# Patient Record
Sex: Female | Born: 1955
Health system: Southern US, Community
[De-identification: ages and names within clinical notes are randomized; demographics above are authoritative.]

## PROBLEM LIST (undated history)

## (undated) DIAGNOSIS — K3184 Gastroparesis: Secondary | ICD-10-CM

## (undated) DIAGNOSIS — L089 Local infection of the skin and subcutaneous tissue, unspecified: Secondary | ICD-10-CM

## (undated) DIAGNOSIS — I10 Essential (primary) hypertension: Secondary | ICD-10-CM

## (undated) DIAGNOSIS — E785 Hyperlipidemia, unspecified: Secondary | ICD-10-CM

## (undated) DIAGNOSIS — K5730 Diverticulosis of large intestine without perforation or abscess without bleeding: Secondary | ICD-10-CM

## (undated) DIAGNOSIS — B958 Unspecified staphylococcus as the cause of diseases classified elsewhere: Secondary | ICD-10-CM

## (undated) DIAGNOSIS — M797 Fibromyalgia: Secondary | ICD-10-CM

## (undated) DIAGNOSIS — K219 Gastro-esophageal reflux disease without esophagitis: Secondary | ICD-10-CM

## (undated) DIAGNOSIS — D649 Anemia, unspecified: Secondary | ICD-10-CM

## (undated) DIAGNOSIS — K8020 Calculus of gallbladder without cholecystitis without obstruction: Secondary | ICD-10-CM

## (undated) DIAGNOSIS — K635 Polyp of colon: Secondary | ICD-10-CM

## (undated) DIAGNOSIS — G4730 Sleep apnea, unspecified: Secondary | ICD-10-CM

## (undated) HISTORY — DX: Hyperlipidemia, unspecified: E78.5

## (undated) HISTORY — DX: Essential (primary) hypertension: I10

## (undated) HISTORY — DX: Gastroparesis: K31.84

## (undated) HISTORY — DX: Anemia, unspecified: D64.9

## (undated) HISTORY — DX: Fibromyalgia: M79.7

## (undated) HISTORY — PX: COSMETIC SURGERY: SHX468

## (undated) HISTORY — PX: KNEE ARTHROSCOPY: SUR90

## (undated) HISTORY — DX: Diverticulosis of large intestine without perforation or abscess without bleeding: K57.30

## (undated) HISTORY — DX: Unspecified staphylococcus as the cause of diseases classified elsewhere: B95.8

## (undated) HISTORY — PX: BREAST REDUCTION SURGERY: SHX8

## (undated) HISTORY — DX: Local infection of the skin and subcutaneous tissue, unspecified: L08.9

## (undated) HISTORY — DX: Gastro-esophageal reflux disease without esophagitis: K21.9

## (undated) HISTORY — DX: Polyp of colon: K63.5

## (undated) HISTORY — DX: Morbid (severe) obesity due to excess calories: E66.01

## (undated) HISTORY — DX: Sleep apnea, unspecified: G47.30

## (undated) HISTORY — PX: POLYPECTOMY: SHX149

## (undated) HISTORY — PX: COLONOSCOPY: SHX174

---

## 1998-02-25 ENCOUNTER — Other Ambulatory Visit: Admission: RE | Admit: 1998-02-25 | Discharge: 1998-02-25 | Payer: Self-pay | Admitting: *Deleted

## 1999-05-20 ENCOUNTER — Other Ambulatory Visit: Admission: RE | Admit: 1999-05-20 | Discharge: 1999-05-20 | Payer: Self-pay | Admitting: *Deleted

## 2000-07-05 ENCOUNTER — Encounter: Admission: RE | Admit: 2000-07-05 | Discharge: 2000-07-05 | Payer: Self-pay | Admitting: *Deleted

## 2000-07-05 ENCOUNTER — Encounter: Payer: Self-pay | Admitting: *Deleted

## 2000-10-19 ENCOUNTER — Encounter: Admission: RE | Admit: 2000-10-19 | Discharge: 2001-01-17 | Payer: Self-pay | Admitting: Internal Medicine

## 2001-06-20 ENCOUNTER — Other Ambulatory Visit: Admission: RE | Admit: 2001-06-20 | Discharge: 2001-06-20 | Payer: Self-pay | Admitting: *Deleted

## 2002-09-17 ENCOUNTER — Encounter: Admission: RE | Admit: 2002-09-17 | Discharge: 2002-09-17 | Payer: Self-pay | Admitting: Internal Medicine

## 2002-09-17 ENCOUNTER — Encounter: Payer: Self-pay | Admitting: Internal Medicine

## 2004-10-12 ENCOUNTER — Ambulatory Visit: Payer: Self-pay | Admitting: Family Medicine

## 2005-01-13 ENCOUNTER — Ambulatory Visit: Payer: Self-pay | Admitting: Internal Medicine

## 2005-03-30 ENCOUNTER — Ambulatory Visit: Payer: Self-pay | Admitting: Internal Medicine

## 2005-05-10 ENCOUNTER — Ambulatory Visit: Payer: Self-pay | Admitting: Internal Medicine

## 2005-08-04 ENCOUNTER — Ambulatory Visit: Payer: Self-pay | Admitting: Internal Medicine

## 2005-08-21 ENCOUNTER — Ambulatory Visit: Payer: Self-pay | Admitting: Family Medicine

## 2005-08-25 ENCOUNTER — Ambulatory Visit: Payer: Self-pay | Admitting: Internal Medicine

## 2005-09-08 ENCOUNTER — Ambulatory Visit: Payer: Self-pay | Admitting: Internal Medicine

## 2005-09-12 ENCOUNTER — Ambulatory Visit: Payer: Self-pay | Admitting: Family Medicine

## 2005-10-02 ENCOUNTER — Ambulatory Visit: Payer: Self-pay | Admitting: Internal Medicine

## 2005-10-06 ENCOUNTER — Encounter: Admission: RE | Admit: 2005-10-06 | Discharge: 2005-10-06 | Payer: Self-pay | Admitting: Orthopedic Surgery

## 2005-10-09 ENCOUNTER — Ambulatory Visit (HOSPITAL_BASED_OUTPATIENT_CLINIC_OR_DEPARTMENT_OTHER): Admission: RE | Admit: 2005-10-09 | Discharge: 2005-10-09 | Payer: Self-pay | Admitting: Orthopedic Surgery

## 2005-10-09 ENCOUNTER — Ambulatory Visit (HOSPITAL_COMMUNITY): Admission: RE | Admit: 2005-10-09 | Discharge: 2005-10-09 | Payer: Self-pay | Admitting: Orthopedic Surgery

## 2005-10-11 ENCOUNTER — Ambulatory Visit: Payer: Self-pay | Admitting: Internal Medicine

## 2005-10-18 ENCOUNTER — Ambulatory Visit: Payer: Self-pay | Admitting: Internal Medicine

## 2005-10-24 ENCOUNTER — Ambulatory Visit: Payer: Self-pay | Admitting: Family Medicine

## 2005-10-27 ENCOUNTER — Ambulatory Visit: Payer: Self-pay | Admitting: Internal Medicine

## 2005-11-16 ENCOUNTER — Ambulatory Visit: Payer: Self-pay | Admitting: Internal Medicine

## 2005-11-27 ENCOUNTER — Ambulatory Visit: Payer: Self-pay | Admitting: Internal Medicine

## 2006-01-08 ENCOUNTER — Ambulatory Visit: Payer: Self-pay | Admitting: Internal Medicine

## 2006-01-18 ENCOUNTER — Encounter: Admission: RE | Admit: 2006-01-18 | Discharge: 2006-01-18 | Payer: Self-pay | Admitting: Rheumatology

## 2006-01-31 ENCOUNTER — Encounter: Admission: RE | Admit: 2006-01-31 | Discharge: 2006-01-31 | Payer: Self-pay | Admitting: Rheumatology

## 2006-02-05 ENCOUNTER — Ambulatory Visit: Payer: Self-pay | Admitting: Internal Medicine

## 2006-02-13 ENCOUNTER — Ambulatory Visit: Payer: Self-pay | Admitting: Family Medicine

## 2006-03-07 ENCOUNTER — Ambulatory Visit: Payer: Self-pay | Admitting: Family Medicine

## 2006-04-06 ENCOUNTER — Ambulatory Visit: Payer: Self-pay | Admitting: Family Medicine

## 2006-04-10 ENCOUNTER — Ambulatory Visit: Payer: Self-pay | Admitting: Internal Medicine

## 2006-05-03 ENCOUNTER — Ambulatory Visit: Payer: Self-pay | Admitting: Internal Medicine

## 2006-06-05 ENCOUNTER — Ambulatory Visit: Payer: Self-pay | Admitting: Internal Medicine

## 2006-06-06 ENCOUNTER — Encounter: Admission: RE | Admit: 2006-06-06 | Discharge: 2006-06-06 | Payer: Self-pay | Admitting: Rheumatology

## 2006-07-04 ENCOUNTER — Ambulatory Visit: Payer: Self-pay | Admitting: Internal Medicine

## 2006-07-18 ENCOUNTER — Encounter: Admission: RE | Admit: 2006-07-18 | Discharge: 2006-08-15 | Payer: Self-pay | Admitting: Internal Medicine

## 2006-08-10 ENCOUNTER — Ambulatory Visit: Payer: Self-pay | Admitting: Internal Medicine

## 2006-09-06 ENCOUNTER — Ambulatory Visit: Payer: Self-pay | Admitting: Internal Medicine

## 2006-09-11 ENCOUNTER — Encounter: Admission: RE | Admit: 2006-09-11 | Discharge: 2006-12-10 | Payer: Self-pay | Admitting: Internal Medicine

## 2006-09-26 ENCOUNTER — Other Ambulatory Visit: Admission: RE | Admit: 2006-09-26 | Discharge: 2006-09-26 | Payer: Self-pay | Admitting: Obstetrics and Gynecology

## 2006-10-05 ENCOUNTER — Ambulatory Visit: Payer: Self-pay | Admitting: Internal Medicine

## 2006-10-10 ENCOUNTER — Ambulatory Visit: Payer: Self-pay | Admitting: Internal Medicine

## 2006-10-10 LAB — CONVERTED CEMR LAB
ALT: 22 units/L (ref 0–40)
AST: 24 units/L (ref 0–37)
BUN: 15 mg/dL (ref 6–23)
Chol/HDL Ratio, serum: 3.1
Cholesterol: 179 mg/dL (ref 0–200)
Creatinine, Ser: 1.1 mg/dL (ref 0.4–1.2)
Free T4: 1 ng/dL (ref 0.9–1.8)
HDL: 57.8 mg/dL (ref >39.0)
Hgb A1c MFr Bld: 6.5 % — ABNORMAL HIGH (ref 4.6–6.0)
LDL Cholesterol: 97 mg/dL (ref 0–99)
Potassium: 3.4 meq/L — ABNORMAL LOW (ref 3.5–5.1)
TSH: 0.75 microintl units/mL (ref 0.35–5.50)
Triglyceride fasting, serum: 123 mg/dL (ref 0–149)
VLDL: 25 mg/dL (ref 0–40)

## 2006-10-26 ENCOUNTER — Ambulatory Visit: Payer: Self-pay | Admitting: Internal Medicine

## 2006-12-12 ENCOUNTER — Encounter: Admission: RE | Admit: 2006-12-12 | Discharge: 2007-03-12 | Payer: Self-pay | Admitting: Internal Medicine

## 2007-02-04 ENCOUNTER — Encounter: Admission: RE | Admit: 2007-02-04 | Discharge: 2007-02-04 | Payer: Self-pay | Admitting: Rheumatology

## 2007-02-21 ENCOUNTER — Ambulatory Visit: Payer: Self-pay | Admitting: Internal Medicine

## 2007-02-21 LAB — CONVERTED CEMR LAB: Vitamin B-12: 255 pg/mL (ref 211–911)

## 2007-03-07 ENCOUNTER — Encounter: Admission: RE | Admit: 2007-03-07 | Discharge: 2007-03-07 | Payer: Self-pay | Admitting: Rheumatology

## 2007-03-20 ENCOUNTER — Ambulatory Visit (HOSPITAL_BASED_OUTPATIENT_CLINIC_OR_DEPARTMENT_OTHER): Admission: RE | Admit: 2007-03-20 | Discharge: 2007-03-20 | Payer: Self-pay | Admitting: Rheumatology

## 2007-03-24 ENCOUNTER — Ambulatory Visit: Payer: Self-pay | Admitting: Internal Medicine

## 2007-03-26 ENCOUNTER — Encounter: Admission: RE | Admit: 2007-03-26 | Discharge: 2007-03-26 | Payer: Self-pay | Admitting: Rheumatology

## 2007-04-02 ENCOUNTER — Ambulatory Visit: Payer: Self-pay | Admitting: Internal Medicine

## 2007-04-02 ENCOUNTER — Encounter: Payer: Self-pay | Admitting: Internal Medicine

## 2007-04-23 ENCOUNTER — Encounter: Payer: Self-pay | Admitting: Internal Medicine

## 2007-04-23 ENCOUNTER — Ambulatory Visit: Payer: Self-pay | Admitting: Internal Medicine

## 2007-05-07 ENCOUNTER — Ambulatory Visit: Payer: Self-pay | Admitting: Internal Medicine

## 2007-05-07 LAB — CONVERTED CEMR LAB: Hgb A1c MFr Bld: 6.6 % — ABNORMAL HIGH (ref 4.6–6.0)

## 2007-05-27 ENCOUNTER — Ambulatory Visit: Payer: Self-pay | Admitting: Internal Medicine

## 2007-05-27 DIAGNOSIS — M810 Age-related osteoporosis without current pathological fracture: Secondary | ICD-10-CM | POA: Insufficient documentation

## 2007-05-27 DIAGNOSIS — E039 Hypothyroidism, unspecified: Secondary | ICD-10-CM | POA: Insufficient documentation

## 2007-05-27 LAB — CONVERTED CEMR LAB: TSH: 1.04 microintl units/mL (ref 0.35–5.50)

## 2007-05-28 ENCOUNTER — Encounter: Payer: Self-pay | Admitting: Internal Medicine

## 2007-05-31 ENCOUNTER — Encounter (INDEPENDENT_AMBULATORY_CARE_PROVIDER_SITE_OTHER): Payer: Self-pay | Admitting: *Deleted

## 2007-05-31 DIAGNOSIS — G4733 Obstructive sleep apnea (adult) (pediatric): Secondary | ICD-10-CM | POA: Insufficient documentation

## 2007-08-07 ENCOUNTER — Ambulatory Visit: Payer: Self-pay | Admitting: Internal Medicine

## 2007-08-07 ENCOUNTER — Telehealth (INDEPENDENT_AMBULATORY_CARE_PROVIDER_SITE_OTHER): Payer: Self-pay | Admitting: *Deleted

## 2007-08-07 LAB — CONVERTED CEMR LAB
Bilirubin Urine: NEGATIVE
Blood in Urine, dipstick: NEGATIVE
Glucose, Urine, Semiquant: NEGATIVE
Ketones, urine, test strip: NEGATIVE
Nitrite: NEGATIVE
Protein, U semiquant: NEGATIVE
Specific Gravity, Urine: 1.02
Urobilinogen, UA: NEGATIVE
WBC Urine, dipstick: NEGATIVE
pH: 5

## 2007-08-08 ENCOUNTER — Encounter: Payer: Self-pay | Admitting: Internal Medicine

## 2007-08-10 LAB — CONVERTED CEMR LAB: Hgb A1c MFr Bld: 7.1 % — ABNORMAL HIGH (ref 4.6–6.0)

## 2007-08-12 ENCOUNTER — Encounter (INDEPENDENT_AMBULATORY_CARE_PROVIDER_SITE_OTHER): Payer: Self-pay | Admitting: *Deleted

## 2007-08-15 ENCOUNTER — Ambulatory Visit: Payer: Self-pay | Admitting: Internal Medicine

## 2007-08-15 DIAGNOSIS — E119 Type 2 diabetes mellitus without complications: Secondary | ICD-10-CM | POA: Insufficient documentation

## 2007-08-15 DIAGNOSIS — I10 Essential (primary) hypertension: Secondary | ICD-10-CM | POA: Insufficient documentation

## 2007-09-30 ENCOUNTER — Ambulatory Visit: Payer: Self-pay | Admitting: Internal Medicine

## 2007-10-02 ENCOUNTER — Ambulatory Visit: Payer: Self-pay | Admitting: Internal Medicine

## 2007-10-02 LAB — CONVERTED CEMR LAB
BUN: 13 mg/dL (ref 6–23)
Basophils Absolute: 0.1 10*3/uL (ref 0.0–0.1)
Basophils Relative: 0.8 % (ref 0.0–1.0)
Creatinine, Ser: 1 mg/dL (ref 0.4–1.2)
Eosinophils Absolute: 0.2 10*3/uL (ref 0.0–0.6)
Eosinophils Relative: 2.6 % (ref 0.0–5.0)
HCT: 33.2 % — ABNORMAL LOW (ref 36.0–46.0)
Hemoglobin: 11.2 g/dL — ABNORMAL LOW (ref 12.0–15.0)
Lymphocytes Relative: 36.2 % (ref 12.0–46.0)
MCHC: 33.6 g/dL (ref 30.0–36.0)
MCV: 86.6 fL (ref 78.0–100.0)
Monocytes Absolute: 0.5 10*3/uL (ref 0.2–0.7)
Monocytes Relative: 7.3 % (ref 3.0–11.0)
Neutro Abs: 3.3 10*3/uL (ref 1.4–7.7)
Neutrophils Relative %: 53.1 % (ref 43.0–77.0)
Platelets: 355 10*3/uL (ref 150–400)
Potassium: 3.7 meq/L (ref 3.5–5.1)
RBC: 3.84 M/uL — ABNORMAL LOW (ref 3.87–5.11)
RDW: 14.7 % — ABNORMAL HIGH (ref 11.5–14.6)
Vitamin B-12: 244 pg/mL (ref 211–911)
WBC: 6.4 10*3/uL (ref 4.5–10.5)

## 2007-10-03 ENCOUNTER — Telehealth (INDEPENDENT_AMBULATORY_CARE_PROVIDER_SITE_OTHER): Payer: Self-pay | Admitting: *Deleted

## 2007-10-04 DIAGNOSIS — R5383 Other fatigue: Secondary | ICD-10-CM | POA: Insufficient documentation

## 2007-10-04 DIAGNOSIS — K219 Gastro-esophageal reflux disease without esophagitis: Secondary | ICD-10-CM | POA: Insufficient documentation

## 2007-10-04 DIAGNOSIS — D649 Anemia, unspecified: Secondary | ICD-10-CM | POA: Insufficient documentation

## 2007-10-04 DIAGNOSIS — Z9189 Other specified personal risk factors, not elsewhere classified: Secondary | ICD-10-CM | POA: Insufficient documentation

## 2007-10-04 DIAGNOSIS — R5381 Other malaise: Secondary | ICD-10-CM | POA: Insufficient documentation

## 2007-10-08 ENCOUNTER — Ambulatory Visit: Payer: Self-pay | Admitting: Internal Medicine

## 2007-10-08 DIAGNOSIS — E1165 Type 2 diabetes mellitus with hyperglycemia: Secondary | ICD-10-CM

## 2007-10-08 DIAGNOSIS — IMO0001 Reserved for inherently not codable concepts without codable children: Secondary | ICD-10-CM | POA: Insufficient documentation

## 2007-10-08 DIAGNOSIS — K209 Esophagitis, unspecified without bleeding: Secondary | ICD-10-CM | POA: Insufficient documentation

## 2007-10-10 LAB — CONVERTED CEMR LAB
Hgb A1c MFr Bld: 6.4 % — ABNORMAL HIGH (ref 4.6–6.0)
Iron: 54 ug/dL (ref 42–145)

## 2007-11-04 ENCOUNTER — Ambulatory Visit: Payer: Self-pay | Admitting: Gastroenterology

## 2007-11-06 ENCOUNTER — Encounter (INDEPENDENT_AMBULATORY_CARE_PROVIDER_SITE_OTHER): Payer: Self-pay | Admitting: *Deleted

## 2007-11-06 ENCOUNTER — Ambulatory Visit: Payer: Self-pay | Admitting: Internal Medicine

## 2007-11-07 ENCOUNTER — Telehealth (INDEPENDENT_AMBULATORY_CARE_PROVIDER_SITE_OTHER): Payer: Self-pay | Admitting: *Deleted

## 2007-11-26 ENCOUNTER — Encounter: Payer: Self-pay | Admitting: Internal Medicine

## 2007-11-26 ENCOUNTER — Ambulatory Visit: Payer: Self-pay | Admitting: Gastroenterology

## 2007-11-26 ENCOUNTER — Encounter: Payer: Self-pay | Admitting: Gastroenterology

## 2007-12-04 ENCOUNTER — Telehealth (INDEPENDENT_AMBULATORY_CARE_PROVIDER_SITE_OTHER): Payer: Self-pay | Admitting: *Deleted

## 2007-12-12 ENCOUNTER — Ambulatory Visit (HOSPITAL_COMMUNITY): Admission: RE | Admit: 2007-12-12 | Discharge: 2007-12-12 | Payer: Self-pay | Admitting: Gastroenterology

## 2007-12-20 ENCOUNTER — Encounter: Payer: Self-pay | Admitting: Internal Medicine

## 2008-01-09 ENCOUNTER — Ambulatory Visit: Payer: Self-pay | Admitting: Gastroenterology

## 2008-01-09 DIAGNOSIS — K3184 Gastroparesis: Secondary | ICD-10-CM | POA: Insufficient documentation

## 2008-03-16 ENCOUNTER — Telehealth (INDEPENDENT_AMBULATORY_CARE_PROVIDER_SITE_OTHER): Payer: Self-pay | Admitting: *Deleted

## 2008-03-30 ENCOUNTER — Ambulatory Visit: Payer: Self-pay | Admitting: Internal Medicine

## 2008-03-30 DIAGNOSIS — J310 Chronic rhinitis: Secondary | ICD-10-CM | POA: Insufficient documentation

## 2008-04-12 DIAGNOSIS — J45909 Unspecified asthma, uncomplicated: Secondary | ICD-10-CM | POA: Insufficient documentation

## 2008-04-12 DIAGNOSIS — J302 Other seasonal allergic rhinitis: Secondary | ICD-10-CM | POA: Insufficient documentation

## 2008-04-12 DIAGNOSIS — J3089 Other allergic rhinitis: Secondary | ICD-10-CM

## 2008-06-08 ENCOUNTER — Encounter: Payer: Self-pay | Admitting: Internal Medicine

## 2008-06-11 ENCOUNTER — Encounter: Payer: Self-pay | Admitting: Internal Medicine

## 2008-06-25 ENCOUNTER — Telehealth (INDEPENDENT_AMBULATORY_CARE_PROVIDER_SITE_OTHER): Payer: Self-pay | Admitting: *Deleted

## 2008-06-26 ENCOUNTER — Encounter: Payer: Self-pay | Admitting: Internal Medicine

## 2008-07-23 ENCOUNTER — Telehealth (INDEPENDENT_AMBULATORY_CARE_PROVIDER_SITE_OTHER): Payer: Self-pay | Admitting: *Deleted

## 2008-09-29 ENCOUNTER — Ambulatory Visit: Payer: Self-pay | Admitting: Internal Medicine

## 2008-11-24 ENCOUNTER — Encounter: Admission: RE | Admit: 2008-11-24 | Discharge: 2008-11-24 | Payer: Self-pay | Admitting: Obstetrics and Gynecology

## 2008-11-26 ENCOUNTER — Encounter: Admission: RE | Admit: 2008-11-26 | Discharge: 2008-11-26 | Payer: Self-pay | Admitting: Obstetrics and Gynecology

## 2009-10-25 ENCOUNTER — Telehealth (INDEPENDENT_AMBULATORY_CARE_PROVIDER_SITE_OTHER): Payer: Self-pay | Admitting: *Deleted

## 2009-10-28 ENCOUNTER — Encounter (INDEPENDENT_AMBULATORY_CARE_PROVIDER_SITE_OTHER): Payer: Self-pay | Admitting: *Deleted

## 2010-01-12 ENCOUNTER — Encounter: Payer: Self-pay | Admitting: Internal Medicine

## 2010-04-15 ENCOUNTER — Telehealth: Payer: Self-pay | Admitting: Gastroenterology

## 2010-04-19 ENCOUNTER — Encounter (INDEPENDENT_AMBULATORY_CARE_PROVIDER_SITE_OTHER): Payer: Self-pay | Admitting: *Deleted

## 2010-05-27 ENCOUNTER — Ambulatory Visit: Payer: Self-pay | Admitting: Gastroenterology

## 2010-05-27 DIAGNOSIS — Z860100 Personal history of colon polyps, unspecified: Secondary | ICD-10-CM | POA: Insufficient documentation

## 2010-05-27 DIAGNOSIS — E663 Overweight: Secondary | ICD-10-CM | POA: Insufficient documentation

## 2010-06-06 ENCOUNTER — Telehealth: Payer: Self-pay | Admitting: Gastroenterology

## 2010-09-07 ENCOUNTER — Encounter: Payer: Self-pay | Admitting: Gastroenterology

## 2010-09-08 ENCOUNTER — Encounter: Payer: Self-pay | Admitting: Gastroenterology

## 2010-09-28 ENCOUNTER — Encounter
Admission: RE | Admit: 2010-09-28 | Discharge: 2010-09-28 | Payer: Self-pay | Source: Home / Self Care | Attending: Endocrinology | Admitting: Endocrinology

## 2010-11-24 ENCOUNTER — Telehealth: Payer: Self-pay | Admitting: Gastroenterology

## 2010-11-29 ENCOUNTER — Telehealth (INDEPENDENT_AMBULATORY_CARE_PROVIDER_SITE_OTHER): Payer: Self-pay

## 2010-12-02 ENCOUNTER — Telehealth: Payer: Self-pay | Admitting: Gastroenterology

## 2010-12-11 ENCOUNTER — Encounter: Payer: Self-pay | Admitting: Gastroenterology

## 2010-12-20 NOTE — Miscellaneous (Signed)
Summary: Orders Update  Clinical Lists Changes  Problems: Added new problem of OBSTRUCTIVE SLEEP APNEA (ICD-327.23) - Signed Medications: Removed medication of MACROBID 100 MG  CAPS (NITROFURANTOIN MONOHYD MACRO) 1 two times a day - Signed Added new medication of CIPRO 500 MG  TABS (CIPROFLOXACIN HCL) 1 two times a day - Signed Rx of CIPRO 500 MG  TABS (CIPROFLOXACIN HCL) 1 two times a day;  #20 x 0;  Signed;  Entered by: Marga Melnick MD;  Authorized by: Marga Melnick MD;  Method used: Print then Give to Patient Orders: Added new Referral order of Sleep Disorder Referral (Sleep Disorder) - Signed    Prescriptions: CIPRO 500 MG  TABS (CIPROFLOXACIN HCL) 1 two times a day  #20 x 0   Entered and Authorized by:   Marga Melnick MD   Signed by:   Marga Melnick MD on 05/31/2007   Method used:   Print then Give to Patient   RxID:   [id number]

## 2010-12-20 NOTE — Letter (Signed)
Summary: Appt Reminder 2  [facility]  [address] [place], [place] [address]   Phone: [phone]  Fax: [phone]        Apr 19, 2010 MRN: [id number]    [facility] [address] [place], [place]  [address]    Dear Ms. Furney,   You have a return appointment with Dr.Robert Arlyce Dice on 05-27-10 at 9:45am.  Please remember to bring a complete list of the medicines you are taking, your insurance card and your co-pay.  If you have to cancel or reschedule this appointment, please call before 5:00 pm the evening before to avoid a cancellation fee.  If you have any questions or concerns, please call [phone].    Sincerely,    Laureen Ochs LPN  Appended Document: Appt Reminder 2 Letter mailed to patient.

## 2010-12-20 NOTE — Progress Notes (Signed)
Summary: rx called in today   Phone Note Call from Patient Call back at Home Phone [phone]   Caller: Patient Call For: Arlyce Dice Reason for Call: Talk to Nurse Summary of Call: Patient needs rx for Nexium called in to [facility] [address] today patient going out of town. Initial call taken by: Tawni Levy,  June 06, 2010 2:15 PM  Follow-up for Phone Call        Called pt to inform that Nexium and Reglan was sent into [facility] on [address]. Follow-up by: Merri Ray CMA Duncan Dull),  June 06, 2010 2:42 PM

## 2010-12-20 NOTE — Letter (Signed)
Summary: Results Follow up Letter  [place] [facility] [place]  [address] [place], [place] [address]   Phone: [phone]  Fax: [phone]    05/31/2007 MRN: [id number]   Toni Palmer [address] [place], [place]  [address]  Dear [name],  The following are the results of your recent test(s):  Test         Result    Pap Smear:        Normal _____  Not Normal _____ Comments: ______________________________________________________ Cholesterol: LDL(Bad cholesterol):         Your goal is less than:         HDL (Good cholesterol):       Your goal is more than: Comments:  ______________________________________________________ Mammogram:        Normal _____  Not Normal _____ Comments:  ___________________________________________________________________ Hemoccult:        Normal _____  Not normal _______ Comments:    _____________________________________________________________________ Other Tests:  Please see attached results and comments   We routinely do not discuss normal results over the telephone.  If you desire a copy of the results, or you have any questions about this information we can discuss them at your next office visit.   Sincerely,

## 2010-12-20 NOTE — Progress Notes (Signed)
Summary: refill flonase   Phone Note Call from Patient   Caller: Patient Call For: YOUNG Summary of Call: PT NEEDS REFILL OF FLONASE AND SAYS [facility] [address] HAS NOT RECEIVED A RESPONSE FOR REQ OF REFILL.  PT# [phone]. PATIENT'S CHART HAS BEEN REQUESTED Initial call taken by: Tivis Ringer,  December 04, 2007 3:53 PM  Follow-up for Phone Call        [facility] on pt's home and work number to notify of rx called in.  Left my name on machines.  Rx was not filled electroniacally because triage was the authorizing provider.  I called pharmacy and authorized as needed refills.  Pt is aware. .................................................................Marland KitchenMarland KitchenVernie Murders  December 04, 2007 4:11 PM  Follow-up by: Boone Master CNA,  December 04, 2007 4:08 PM      Prescriptions: Aleda Grana 50 MCG/ACT  SUSP (FLUTICASONE PROPIONATE) 1-2 sprays qd  #1 x prn   Entered by:   Boone Master CNA   Authorized by:   Pulmonary Triage   Signed by:   Boone Master CNA on 12/04/2007   Method used:   Electronically sent to ...       [facility] [address] [facility]*       [address]       [place], [place]  [address]       Ph: [phone] or [phone]       Fax: [phone]   RxID:   [id number]

## 2010-12-20 NOTE — Letter (Signed)
Summary: Results Follow up Letter   at [place]  [address] [place], [place] [address]   Phone: [phone]  Fax: [phone]    11/06/2007 MRN: [id number]  MARAJADE LEI [address] [place], [place]  [address]  Dear Ms. Bowsher,  The following are the results of your recent test(s):  Test         Result    Pap Smear:        Normal _____  Not Normal _____ Comments: ______________________________________________________ Cholesterol: LDL(Bad cholesterol):         Your goal is less than:         HDL (Good cholesterol):       Your goal is more than: Comments:  ______________________________________________________ Mammogram:        Normal __X___  Not Normal _____ Comments:  ___________________________________________________________________ Hemoccult:        Normal _____  Not normal _______ Comments:    _____________________________________________________________________ Other Tests:    We routinely do not discuss normal results over the telephone.  If you desire a copy of the results, or you have any questions about this information we can discuss them at your next office visit.   Sincerely,

## 2010-12-20 NOTE — Progress Notes (Signed)
Summary: Appointment Due  Phone Note Outgoing Call Call back at [facility] Phone [phone]   Call placed by: Shonna Chock,  October 25, 2009 10:31 AM Call placed to: Insurer Summary of Call: Please contact this patient to schedule a physical (30 min appointment), patient should come fasting for labs at the time of her physical. This is necessay in order for Dr.[name] to continue refilling her medications (If the patient ask)  Chrae Malloy  October 25, 2009 10:32 AM   Follow-up for Phone Call        I left a mess. informing patient to call office and get a  appt. in order to get a refill on meds. Follow-up by: Michaelle Copas,  October 25, 2009 11:19 AM    Additional Follow-up for Phone Call Additional follow up Details #2::    Mailed a letter.Marland KitchenMarland KitchenMarland KitchenBarb Merino  October 28, 2009 10:10 AM  Follow-up by: Barb Merino,  October 28, 2009 10:10 AM

## 2010-12-20 NOTE — Procedures (Signed)
Summary: colonoscopy  Dr Arlyce Dice  colonoscopy   Imported By: Freddy Jaksch 12/02/2007 12:12:23  _____________________________________________________________________  External Attachment:    Type:   Image     Comment:   inter

## 2010-12-20 NOTE — Assessment & Plan Note (Signed)
Summary: ov with meds.cbs  Medications Added ADVAIR DISKUS 250-50 MCG/DOSE MISC (FLUTICASONE-SALMETEROL)  FLONASE 50 MCG/ACT  SUSP (FLUTICASONE PROPIONATE)  CYMBALTA 30 MG  CPEP (DULOXETINE HCL) 3 tabs qd SYNTHROID 50 MCG  TABS (LEVOTHYROXINE SODIUM) 1 by mouth qd * LAMICTIL 150MG  QD  CLORAZEPATE DIPOTASSIUM 15 MG  TABS (CLORAZEPATE DIPOTASSIUM) prn ADDERALL XR 30 MG  CP24 (AMPHETAMINE-DEXTROAMPHETAMINE) 2 tabs qam and 1 qpm FUROSEMIDE 40 MG  TABS (FUROSEMIDE) 1 by mouth qd METFORMIN HCL 500 MG  TABS (METFORMIN HCL) 1 by mouth two times a day with 2 largest meals BUTALBITAL-APAP   TABS (BUTALBITAL-ACETAMINOPHEN TABS) prn * CPAP  MACROBID 100 MG  CAPS (NITROFURANTOIN MONOHYD MACRO) 1 two times a day        Vital Signs:  Patient Profile:   55 Years Old Female Weight:      224 pounds Pulse rate:   72 / minute Pulse rhythm:   regular BP sitting:   142 / 74  (left arm) Cuff size:   large  Pt. in pain?   no  Vitals Entered By: Wendall Stade (May 27, 2007 2:39 PM)                Chief Complaint:  fu  labs.  History of Present Illness: patient also has uti urine is pending. A1c was 6.6%     Family History:    Father: DM,CAD,COAD    Mother: breast CA    Siblings: sister CA    Review of Systems  Resp      Complains of hypersomnolence.      + sleep apnea; on CPAP  GU      Complains of dysuria and urinary hesitancy.      Denies hematuria and urinary frequency.   Physical Exam  General:     overweight-appearing.   Neck:     No deformities, masses, or tenderness noted. Lungs:     Normal respiratory effort, chest expands symmetrically. Lungs are clear to auscultation, no crackles or wheezes. Heart:     grade1  /6 systolic murmur.   Pulses:     R and L carotid,radial,femoral,dorsalis pedis and posterior tibial pulses are full and equal bilaterally Extremities:     No clubbing, cyanosis, edema, or deformity noted with normal full range of motion of all  joints.      Impression & Recommendations:  Problem # 1:  DIABETES-TYPE 2 (ICD-250.00)  Her updated medication list for this problem includes:    Metformin Hcl 500 Mg Tabs (Metformin hcl) .Marland Kitchen... 1 by mouth two times a day with 2 largest meals   Problem # 2:  U T I (ICD-599.0)  Orders: T-Culture, Urine ([id number])  Her updated medication list for this problem includes:    Macrobid 100 Mg Caps (Nitrofurantoin monohyd macro) .Marland Kitchen... 1 two times a day   Problem # 3:  HYPOTHYROIDISM (ICD-244.9)  Her updated medication list for this problem includes:    Synthroid 50 Mcg Tabs (Levothyroxine sodium) .Marland Kitchen... 1 by mouth qd  Orders: TLB-TSH (Thyroid Stimulating Hormone) (84443-TSH)   Medications Added to Medication List This Visit: 1)  Advair Diskus 250-50 Mcg/dose Misc (Fluticasone-salmeterol) 2)  Flonase 50 Mcg/act Susp (Fluticasone propionate) 3)  Cymbalta 30 Mg Cpep (Duloxetine hcl) .... 3 tabs qd 4)  Synthroid 50 Mcg Tabs (Levothyroxine sodium) .Marland Kitchen.. 1 by mouth qd 5)  Lamictil 150mg  Qd  6)  Clorazepate Dipotassium 15 Mg Tabs (Clorazepate dipotassium) .... Prn 7)  Adderall Xr 30 Mg Cp24 (Amphetamine-dextroamphetamine) .Marland KitchenMarland KitchenMarland Kitchen  2 tabs qam and 1 qpm 8)  Furosemide 40 Mg Tabs (Furosemide) .Marland Kitchen.. 1 by mouth qd 9)  Metformin Hcl 500 Mg Tabs (Metformin hcl) .Marland Kitchen.. 1 by mouth two times a day with 2 largest meals 10)  Butalbital-apap Tabs (Butalbital-acetaminophen tabs) .... Prn 11)  Cpap  12)  Macrobid 100 Mg Caps (Nitrofurantoin monohyd macro) .Marland Kitchen.. 1 two times a day   Patient Instructions: 1)  recheck A1c mid 9/08; see 2-3 days later (250.02)    Prescriptions: METFORMIN HCL 500 MG  TABS (METFORMIN HCL) 1 by mouth two times a day with 2 largest meals  #180 x 1   Entered and Authorized by:   Marga Melnick MD   Signed by:   Marga Melnick MD on 05/27/2007   Method used:   Print then Give to Patient   RxID:   [id number] MACROBID 100 MG  CAPS (NITROFURANTOIN MONOHYD MACRO) 1 two  times a day  #20 x 0   Entered and Authorized by:   Marga Melnick MD   Signed by:   Marga Melnick MD on 05/27/2007   Method used:   Print then Give to Patient   RxID:   [id number]

## 2010-12-20 NOTE — Letter (Signed)
Summary: [facility]  [facility]   Imported By: Stephannie Li 06/10/2008 15:46:00  _____________________________________________________________________  External Attachment:    Type:   Image     Comment:   External Document

## 2010-12-20 NOTE — Assessment & Plan Note (Signed)
Summary: to review labs//tl  Medications Added LISINOPRIL 20 MG  TABS (LISINOPRIL) 1 once daily if BP averages > 130/80 patient not using * CALCIUM WITH VIT D       Allergies Added: NKDA  Vital Signs:  Patient Profile:   55 Years Old Female Weight:      243 pounds Pulse rate:   72 / minute Pulse rhythm:   regular Resp:     15 per minute BP sitting:   140 / 80  (left arm) Cuff size:   large  Pt. in pain?   no  Vitals Entered By: Wendall Stade (October 08, 2007 9:22 AM)                  Chief Complaint:  follow up labs and Type 2 diabetes mellitus follow-up.  History of Present Illness: She was taking Amaryl  4 mg two times a day instead of 1/2 two times a day. Frequent hypoglycemia , especially in am. CBC reveals mild anemia with HCT 33.2(37.6 in 2006); neg GI ROS. Overdue for colonoscopy; FOB neg in 2006. Off B12 shots since 7/08;level low normal @ 244.  Type 2 Diabetes Mellitus Follow-Up      This is a 55 year old woman who presents for Type 2 diabetes mellitus follow-up.  FBS average=60; pc 109.  The patient reports polydipsia, self managed hypoglycemia, and weight gain, but denies polyuria, blurred vision, hypoglycemia requiring help, weight loss, and numbness of extremities.  The patient denies the following symptoms: neuropathic pain, chest pain, vomiting, orthostatic symptoms, poor wound healing, intermittent claudication, vision loss, and foot ulcer.  Since the last visit the patient reports good dietary compliance, noncompliance with medications, exercising regularly, and monitoring blood glucose.  The patient has been measuring capillary blood glucose before breakfast, after lunch, and after dinner.  Since the last visit, the patient reports having had eye care by an ophthalmologist and no foot care.  "Low carb  & calorie diet this year & Nutritionist's program in 2007 didn't work"  Hypertension History:      Positive major cardiovascular risk factors include  diabetes and hypertension.  Negative major cardiovascular risk factors include female age less than 105 years old.     Current Allergies (reviewed today): No known allergies      Review of Systems  General      Complains of sweats.      Denies chills, fever, and weight loss.      Gyn Dxed hormonal cause  ENT      Denies nosebleeds.  GI      Denies abdominal pain, bloody stools, change in bowel habits, constipation, dark tarry stools, diarrhea, indigestion, nausea, and vomiting.      NO dysphagia  GU      Denies hematuria.  Heme      Denies abnormal bruising and bleeding.   Physical Exam  General:     Well-developed,well-nourished,in no acute distress; alert,appropriate and cooperative throughout examination;overweight-appearing.   Neck:     No deformities, masses, or tenderness noted. Lungs:     Normal respiratory effort, chest expands symmetrically. Lungs are clear to auscultation, no crackles or wheezes. Heart:     normal rate, regular rhythm, no gallop, no rub, no JVD, no HJR, and grade 1 /6 systolic murmur.   Abdomen:     Bowel sounds positive,abdomen soft and non-tender without masses, organomegaly or hernias noted. Pulses:     R and L carotid,radial,dorsalis pedis and posterior tibial pulses are  full and equal bilaterally Extremities:     No clubbing, cyanosis, edema, or deformity noted with normal full range of motion of all joints.   Skin:     Intact without suspicious lesions or rashes Cervical Nodes:     No lymphadenopathy noted Axillary Nodes:     No palpable lymphadenopathy Psych:     ? elements of projection (see comments re diets not working)    Impression & Recommendations:  Problem # 1:  DIABETES MELLITUS, TYPE II, UNCONTROLLED (ICD-250.02)  Her updated medication list for this problem includes:    Glimepiride 4 Mg Tabs (Glimepiride) .Marland Kitchen... 1/2 bid    Lisinopril 20 Mg Tabs (Lisinopril) .Marland Kitchen... 1 once daily if bp averages > 130/80 patient not  using  Orders: TLB-A1C / Hgb A1C (Glycohemoglobin) (83036-A1C)   Problem # 2:  UNSPECIFIED ANEMIA (ICD-285.9)  Orders: TLB-Iron, (Fe) Total (83540-FE)   Problem # 3:  FATIGUE, CHRONIC (ICD-780.79)  Problem # 4:  GERD (ICD-530.1)  Orders: Gastroenterology Referral (GI)   Complete Medication List: 1)  Nexium 40 Mg Cpdr (Esomeprazole magnesium) .Marland Kitchen.. 1 twice daily 2)  Advair Diskus 250-50 Mcg/dose Misc (Fluticasone-salmeterol) .Marland Kitchen.. 1 puff bid 3)  Flonase 50 Mcg/act Susp (Fluticasone propionate) .Marland Kitchen.. 1-2 sprays qd 4)  Cymbalta 30 Mg Cpep (Duloxetine hcl) .... 3 tabs qd 5)  Synthroid 50 Mcg Tabs (Levothyroxine sodium) .Marland Kitchen.. 1 by mouth qd 6)  Lamictil 75mg  Qd  7)  Clorazepate Dipotassium 15 Mg Tabs (Clorazepate dipotassium) .... Prn 8)  Adderall Xr 30 Mg Cp24 (Amphetamine-dextroamphetamine) .... 2 tabs qam and 1 qpm 9)  Furosemide 40 Mg Tabs (Furosemide) .Marland Kitchen.. 1and 1/2 tabs daily for five days and then back to 1 tab daily office visit if swelling not improved 10)  Metformin Hcl1000 Mg Tabs (metformin Hcl)  .Marland Kitchen.. 1 by mouth two times a day with 2 largest meals 11)  Butalbital-apap Tabs (Butalbital-acetaminophen tabs) .... Prn 12)  Cpap  13)  Aleve 3 Tabs Bid  14)  Multivitamin  15)  Vit B12  16)  Vit D and C  17)  Glimepiride 4 Mg Tabs (Glimepiride) .... 1/2 bid 18)  Lisinopril 20 Mg Tabs (Lisinopril) .Marland Kitchen.. 1 once daily if bp averages > 130/80 patient not using 19)  Calcium With Vit D   Hypertension Assessment/Plan:      The patient's hypertensive risk group is category C: Target organ damage and/or diabetes.  Her calculated 10 year risk of coronary heart disease is 13 %.  Today's blood pressure is 140/80.     Patient Instructions: 1)  Complete stool cards    ]

## 2010-12-20 NOTE — Progress Notes (Signed)
Summary: refill   Phone Note Call from Patient   Summary of Call: dr. hopper patient's pharmacy never recieved her rx from the 28th. she uses [facility] [address] Initial call taken by: Charolette Child,  July 23, 2008 2:50 PM  Follow-up for Phone Call        Spoke with pt informed rx refaxed to [facility] .Kandice Hams  July 23, 2008 4:29 PM  Follow-up by: Kandice Hams,  July 23, 2008 4:29 PM

## 2010-12-20 NOTE — Medication Information (Signed)
Summary: Prior Authorization for Nexium/[facility]  Prior Authorization for Nexium/[facility]   Imported By: Lanelle Bal 07/17/2008 11:53:56  _____________________________________________________________________  External Attachment:    Type:   Image     Comment:   External Document

## 2010-12-20 NOTE — Progress Notes (Signed)
Summary: hopper--REFILL  Phone Note Call from Patient Call back at Home Phone [phone]   Caller: Patient Reason for Call: Refill Medication Summary of Call: PT IS CASLLING ABOUT HER NEXIUM  40 TO BE FAXED TO [facility] [address] Initial call taken by: Freddy Jaksch,  July 23, 2008 4:50 PM  Follow-up for Phone Call        spoke with  [facility] asked did get rx for furosemide pt needs neium also #60 5 refills called in spoke with pt have 2 rx ready for pickup.Kandice Hams  July 23, 2008 4:58 PM  Follow-up by: Kandice Hams,  July 23, 2008 4:58 PM      Prescriptions: NEXIUM 40 MG  CPDR (ESOMEPRAZOLE MAGNESIUM) 1 twice daily  #60 x 5   Entered by:   Kandice Hams   Authorized by:   Marga Melnick MD   Signed by:   Kandice Hams on 07/23/2008   Method used:   Telephoned to ...       [facility] [address]* (retail)       [address]       [place], [place]  [address]       Ph: [phone] or [phone]       Fax: [phone]   RxID:   [id number]

## 2010-12-20 NOTE — Progress Notes (Signed)
Summary: FYI  Phone Note Other Incoming   Call placed by: Doristine Devoid,  August 07, 2007 4:26 PM Call placed to: Patient Details for Reason: FYI Summary of Call: PT CAME IN FOR LAB WORK THEN WANTED TO LEAVE A URINE SPECIMEN BECAUSE SHE THINK SHE MAY HAVE UTI BECAUSE OF BURNING AND URGENCY AND SHE DIDN'T WANT TO HAVE TO CAMEBACK FOR OV. I DIPPED URINE IT WAS NEGATIVE BUT SENT FOR CULTURE ANYWAY

## 2010-12-20 NOTE — Progress Notes (Signed)
Summary: Refill/OV  Medications Added NEXIUM 40 MG  CPDR (ESOMEPRAZOLE MAGNESIUM) 1 twice daily ADVAIR DISKUS 250-50 MCG/DOSE MISC (FLUTICASONE-SALMETEROL)  VENTOLIN HFA 108 (90 BASE) MCG/ACT  AERS (ALBUTEROL SULFATE) Inhale 2 puffs every 4 hours as needed PROVENTIL HFA 108 (90 BASE) MCG/ACT  AERS (ALBUTEROL SULFATE) 2 puffs four times a day as needed FLONASE 50 MCG/ACT  SUSP (FLUTICASONE PROPIONATE)  ADVAIR DISKUS 250-50 MCG/DOSE MISC (FLUTICASONE-SALMETEROL) 1 puff bid FLONASE 50 MCG/ACT  SUSP (FLUTICASONE PROPIONATE) 1-2 sprays qd FLONASE 50 MCG/ACT  SUSP (FLUTICASONE PROPIONATE) 1-2 sprays qd CYMBALTA 30 MG  CPEP (DULOXETINE HCL) 3 tabs qd SYNTHROID 112 MCG TABS (LEVOTHYROXINE SODIUM) Take 1 tablet by mouth once a day SYNTHROID 50 MCG  TABS (LEVOTHYROXINE SODIUM) 1 by mouth qd * LAMICTIL 150MG  QD  * LAMICTIL 75MG  QD  CLORAZEPATE DIPOTASSIUM 15 MG  TABS (CLORAZEPATE DIPOTASSIUM) prn ADDERALL XR 30 MG  CP24 (AMPHETAMINE-DEXTROAMPHETAMINE) 2 tabs qam and 1 qpm ADDERALL XR 30 MG  CP24 (AMPHETAMINE-DEXTROAMPHETAMINE) 1 tabs qam and 1 qpm FUROSEMIDE 40 MG  TABS (FUROSEMIDE) 1 by mouth qd FUROSEMIDE 40 MG  TABS (FUROSEMIDE) Take 1 tablet by mouth once a day**OFFICE VIST DUE NOW** FUROSEMIDE 40 MG  TABS (FUROSEMIDE) Take 1 tablet by mouth once a day FUROSEMIDE 40 MG  TABS (FUROSEMIDE) 1and 1/2 tabs daily for five days and then back to 1 tab daily office visit if swelling not improved METFORMIN HCL 500 MG  TABS (METFORMIN HCL) 1 by mouth two times a day with 2 largest meals * METFORMIN HCL1000 MG  TABS (METFORMIN HCL) 1 by mouth two times a day with 2 largest meals BUTALBITAL-APAP   TABS (BUTALBITAL-ACETAMINOPHEN TABS) prn MACROBID 100 MG  CAPS (NITROFURANTOIN MONOHYD MACRO) 1 two times a day MACROBID 100 MG  CAPS (NITROFURANTOIN MONOHYD MACRO) 1 two times a day CIPRO 500 MG  TABS (CIPROFLOXACIN HCL) 1 two times a day CIPRO 500 MG  TABS (CIPROFLOXACIN HCL) 1 two times a day FUROSEMIDE 40 MG   TABS (FUROSEMIDE) take once daily as needed FUROSEMIDE 40 MG  TABS (FUROSEMIDE) take once daily as needed * CPAP  * CPAP 12 CWP  * ALEVE 3 TABS BID  * VIT B12  * VIT B12  * MULTIVITAMIN  GLIMEPIRIDE 4 MG  TABS (GLIMEPIRIDE) 1/2 bid GLIMEPIRIDE 4 MG  TABS (GLIMEPIRIDE) 1/2 bid LISINOPRIL 20 MG  TABS (LISINOPRIL) 1 once daily if BP averages > 130/80 LISINOPRIL 20 MG  TABS (LISINOPRIL) 1 once daily if BP averages > 130/80 patient not using LISINOPRIL 20 MG  TABS (LISINOPRIL) 1 once daily if BP averages > 130/80 patient not using * VIT D AND C  * CALCIUM WITH VIT D  ZITHROMAX Z-PAK 250 MG  TABS (AZITHROMYCIN) 2 today then one daily ZITHROMAX Z-PAK 250 MG  TABS (AZITHROMYCIN) 2 today then one daily METOCLOPRAMIDE HCL 10 MG TABS (METOCLOPRAMIDE HCL) 1 by mouth four times a day 30 minutes before meals and at bedtime       Phone Note Call from Patient Call back at Home Phone [phone]   Caller: Patient Call For: Dr. Arlyce Dice Reason for Call: Talk to Nurse Summary of Call: wants to know if she needs to have an ov before refilling Reglan and Nexium Initial call taken by: Vallarie Mare,  Apr 15, 2010 2:24 PM  Follow-up for Phone Call        Last seen 01-09-2008, will give 1 refill on Reglan and Nexium, no further refills until she is seen in the office  05-27-10 at 9:45am, pt. aware and agrees. Pt. instructed to call back as needed.  Follow-up by: Laureen Ochs LPN,  Apr 19, 2010 11:11 AM    Prescriptions: METOCLOPRAMIDE HCL 10 MG TABS (METOCLOPRAMIDE HCL) 1 by mouth four times a day 30 minutes before meals and at bedtime  #120 x 0   Entered by:   Laureen Ochs LPN   Authorized by:   Louis Meckel MD   Signed by:   Laureen Ochs LPN on 16/08/9603   Method used:   Electronically to        [facility] [address] [facility]* (retail)       [address]       [place], [place]  [address]       Ph: [phone] or [phone]       Fax: [phone]   RxID:    [id number] NEXIUM 40 MG  CPDR (ESOMEPRAZOLE MAGNESIUM) 1 twice daily  #60 x 0   Entered by:   Laureen Ochs LPN   Authorized by:   Louis Meckel MD   Signed by:   Laureen Ochs LPN on 40/34/7425   Method used:   Electronically to        [facility] [address] [facility] (retail)       [address]       [place], [place]  [address]       Ph: [phone] or [phone]       Fax: [phone]   RxID:   [id number]

## 2010-12-20 NOTE — Assessment & Plan Note (Signed)
Summary: 6 MONTHS/APC   PCP:  W. [name]/ B. Balan  Chief Complaint:  6 month follow-up visit.  History of Present Illness:   From 03/30/08-OSA, rhinitis, asthma Toni Palmer returns for follow-up of her sleep apnea.  She says she is comfortable using CPAP at 12 CWP  every night.  She has been using her rescue albuterol inhaler more frequently lately and this morning woke with head congestion.  She thinks she has a sinus infection, draining clear mucus.  She takes Fiorinal  if needed for headache, and currently denies significant headache.  Reglan has been added for gastroparesis. We discussed  daytime sleepiness.  She carries Adderall on her medication list and uses  it without problems identified.  09/29/08- Complains water in cpap hose. Discussed humidifier. Comfortable at 12 cwp. Asks about alternatives- surgery. long talk about optimal therapies. She notices within a day if she forgets her nsal spray. No recent infection or wheeze.       Prior Medications Reviewed Using: Patient Recall  Updated Prior Medication List: NEXIUM 40 MG  CPDR (ESOMEPRAZOLE MAGNESIUM) 1 twice daily VENTOLIN HFA 108 (90 BASE) MCG/ACT  AERS (ALBUTEROL SULFATE) Inhale 2 puffs every 4 hours as needed ADVAIR DISKUS 250-50 MCG/DOSE MISC (FLUTICASONE-SALMETEROL) 1 puff bid FLONASE 50 MCG/ACT  SUSP (FLUTICASONE PROPIONATE) 1-2 sprays qd CYMBALTA 30 MG  CPEP (DULOXETINE HCL) 3 tabs qd SYNTHROID 112 MCG TABS (LEVOTHYROXINE SODIUM) Take 1 tablet by mouth once a day * LAMICTIL 75MG  QD  CLORAZEPATE DIPOTASSIUM 15 MG  TABS (CLORAZEPATE DIPOTASSIUM) prn ADDERALL XR 30 MG  CP24 (AMPHETAMINE-DEXTROAMPHETAMINE) 1 tabs qam and 1 qpm FUROSEMIDE 40 MG  TABS (FUROSEMIDE) Take 1 tablet by mouth once a day * METFORMIN HCL1000 MG  TABS (METFORMIN HCL) 1 by mouth two times a day with 2 largest meals BUTALBITAL-APAP   TABS (BUTALBITAL-ACETAMINOPHEN TABS) prn * CPAP 12 CWP  * ALEVE 3 TABS BID  * MULTIVITAMIN  * VIT D AND C  *  CALCIUM WITH VIT D   Current Allergies (reviewed today): No known allergies   Past Medical History:    Reviewed history from 03/30/2008 and no changes required:       recently Dxed with sleep apnea       GERD       Anemia-NOS       Colon Polyps       Allergic Rhinitis       Asthma       Diabetes, Type 2       Sleep Apnea     Review of Systems      See HPI   Vital Signs:  Patient Profile:   55 Years Old Female Weight:      253 pounds O2 Sat:      95 % O2 treatment:    Room Air Pulse rate:   106 / minute BP sitting:   140 / 78  (right arm) Cuff size:   large  Vitals Entered By: Cloyde Reams RN (September 29, 2008 2:26 PM)             Comments Pt is here today for a 6 month follow-up visit.  Pt states her asthma seems to be a little worse x 4 months. Pt states CPAP is blowing water through the hose into nose.  Medications reviewed Cloyde Reams RN  September 29, 2008 2:31 PM      Physical Exam  General: A/Ox3; pleasant and cooperative, NAD, obese SKIN: no rash, lesions NODES: no lymphadenopathy  HEENT: /AT, EOM- WNL, Conjuctivae- clear, PERRLA, TM-WNL, Nose- clear, Throat- clear and wnl, Melampatti III NECK: Supple w/ fair ROM, JVD- none, normal carotid impulses w/o bruits Thyroid- normal to palpation CHEST: Clear to P&A HEART: RRR, no m/g/r heard ABDOMEN: Soft and nl; nml bowel sounds; no organomegaly or masses noted JWJ:XBJY, nl pulses, no edema  NEURO: Grossly intact to observation         Problem # 1:  OBSTRUCTIVE SLEEP APNEA (ICD-327.23) Still adjusting, but learning to make cpap work for her. Weight loss would help.  Problem # 2:  ASTHMA (ICD-493.90)  Her updated medication list for this problem includes:    Ventolin Hfa 108 (90 Base) Mcg/act Aers (Albuterol sulfate) ..... Inhale 2 puffs every 4 hours as needed    Advair Diskus 250-50 Mcg/dose Misc (Fluticasone-salmeterol) .Marland Kitchen... 1 puff bid   Problem # 3:  ALLERGIC RHINITIS  (ICD-477.9) Discussed Nasonex Her updated medication list for this problem includes:    Flonase 50 Mcg/act Susp (Fluticasone propionate) .Marland Kitchen... 1-2 sprays qd   Medications Added to Medication List This Visit: 1)  Synthroid 112 Mcg Tabs (Levothyroxine sodium) .... Take 1 tablet by mouth once a day 2)  Adderall Xr 30 Mg Cp24 (Amphetamine-dextroamphetamine) .Marland Kitchen.. 1 tabs qam and 1 qpm 3)  Furosemide 40 Mg Tabs (Furosemide) .... Take 1 tablet by mouth once a day   Patient Instructions: 1)  Please schedule a follow-up appointment in 1 year. 2)  Call if you want to talk with an ENT surgeon about surgical alternatives for cpap 3)  Try turning down the humidifier unit enough to stop the condensation in the tube. Ask your homecare company for help if needed.   ]

## 2010-12-20 NOTE — Medication Information (Signed)
Summary: Approved Nexium / [employer]  Approved Nexium / [employer]   Imported By: Lennie Odor 09/14/2010 11:58:53  _____________________________________________________________________  External Attachment:    Type:   Image     Comment:   External Document

## 2010-12-20 NOTE — Miscellaneous (Signed)
Summary: Nexium Approved  Clinical Lists Changes Nexium approved from [employer] till 09/07/2011

## 2010-12-20 NOTE — Assessment & Plan Note (Signed)
Summary: FOLLOW UP/ MBW   Visit Type:  Follow-up PCP:  W. [name]/ B. Balan  Chief Complaint:  follow up visit .  History of Present Illness: Current Problems:  GASTROPARESIS (ICD-536.3) GERD (ICD-530.1) UNSPECIFIED ANEMIA (ICD-285.9) DIABETES MELLITUS, TYPE II, UNCONTROLLED (ICD-250.02) REDUCTION MAMMOPLASTY, HX OF (ICD-V15.9) FATIGUE, CHRONIC (ICD-780.79) ANEMIA-NOS (ICD-285.9) GERD (ICD-530.81) HYPERTENSION, ESSENTIAL NOS (ICD-401.9) DIABETES-TYPE 2 (ICD-250.00) OBSTRUCTIVE SLEEP APNEA (ICD-327.23) HYPOTHYROIDISM (ICD-244.9) OSTEOPOROSIS (ICD-733.00)  Toni Palmer returns for follow-up of her sleep apnea.  She says she is comfortable using CPAP at 12 CWP  every night.  She has been using her rescue albuterol inhaler more frequently lately and this morning woke with head congestion.  She thinks she has a sinus infection, draining clear mucus.  She takes Fiorinal  if needed for headache, and currently denies significant headache.  Reglan has been added for gastroparesis. We discussed  daytime sleepiness.  She carries Adderall on her medication list and uses  it without problems identified.       Current Allergies (reviewed today): No known allergies   Past Medical History:    Reviewed history from 01/09/2008 and no changes required:       recently Dxed with sleep apnea       GERD       Anemia-NOS       Colon Polyps       Allergic Rhinitis       Asthma       Diabetes, Type 2       Sleep Apnea     Review of Systems      See HPI   Vital Signs:  Patient Profile:   55 Years Old Female Weight:      248.38 pounds O2 Sat:      95 % O2 treatment:    Room Air Pulse rate:   83 / minute BP sitting:   132 / 76  (right arm) Cuff size:   large  Vitals Entered By: Reynaldo Minium CMA (Mar 30, 2008 3:08 PM)             Comments Medications reviewed with patient Reynaldo Minium CMA  Mar 30, 2008 3:08 PM      Physical Exam  General:     obese.   Eyes:  PERRLA/EOM intact; conjunctiva and sclera clear Nose:     external nose is red, and she is sniffing Mouth:     Melampatti Class III.   Neck:     no JVD.   Lungs:     clear bilaterally to auscultation and percussion Heart:     regular rate and rhythm, S1, S2 without murmurs, rubs, gallops, or clicks Cervical Nodes:     no significant adenopathy Axillary Nodes:     no significant adenopathy     Impression & Recommendations:  Problem # 1:  RHINITIS (ICD-472.0) she's having some rhinitis.  I'm not sure that she really has a sinus infection, but I agreed to give her a Z-Pak to hold because of the upcoming holiday weekend.  We are also letting her try a sample of astepro nasal spray.  She has been feeling a little chest tightness.  I've added to her Proventil to her medication list.  Problem # 2:  OBSTRUCTIVE SLEEP APNEA (ICD-327.23) sleep apnea control seems adequate and she is going to continue CPAP at 12CWP  We did discuss alternative treatments, and different styles of CPAP masks.  Weight loss was advised. I would like her to minimize Adderall.  We  discussed this medication.  I at least want to be sure  that she is clear what she is using it for.  Medications Added to Medication List This Visit: 1)  Proventil Hfa 108 (90 Base) Mcg/act Aers (Albuterol sulfate) .... 2 puffs four times a day as needed 2)  Cpap 12 Cwp  3)  Zithromax Z-pak 250 Mg Tabs (Azithromycin) .... 2 today then one daily  Other Orders: Est. Patient Level III (72536)   Patient Instructions: 1)  Please schedule a follow-up appointment in 6 months. 2)  Try sample Astepro nasal spray, 1-2 puffs each nostril as needed for watery nose/ allergy 3)  Continue cpap at 12 cwp- ask your home care company about looking at alternative cpap masks   Prescriptions: ZITHROMAX Z-PAK 250 MG  TABS (AZITHROMYCIN) 2 today then one daily  #1 pak x 0   Entered and Authorized by:   Waymon Budge MD   Signed by:   Waymon Budge  MD on 03/30/2008   Method used:   Electronically sent to ...       [facility]       [address]       [place], [place]  [address]       Ph: [phone] or [phone]       Fax: [phone]   RxID:   [id number]  ]

## 2010-12-20 NOTE — Assessment & Plan Note (Signed)
   Vital Signs:  Patient Profile:   55 Years Old Female Weight:      220 pounds Temp:     97 degrees F Pulse rate:   84 / minute Resp:     16 per minute BP sitting:   134 / 80  (left arm)  Pt. in pain?   no  Vitals Entered By: Doristine Devoid (Apr 02, 2007 4:25 PM)                Chief Complaint:  sinus infection and cough along with HA x2 weeks.  History of Present Illness: +nasal congestion 2 weeks ago;Rx:no new meds;cough as of 5/10 prompted OV    Past Medical History:    recently Dxed with sleep apnea     Review of Systems  General      Complains of chills, fatigue, and fever.  Eyes      Complains of discharge and red eye.      Denies eye pain and vision loss-both eyes.      onset 5/8 with milky color  ENT      Complains of hoarseness, nasal congestion, and sinus pressure.      Denies difficulty swallowing, earache, and sore throat.  Resp      Complains of cough and sputum productive.      greenish yellow since 5/8  Allergy      Complains of itching eyes.      Denies sneezing.   Physical Exam  General:     hoarse Eyes:     scleritis OS;no conjunctivitis,fullEOM,vision WNL with glasses Ears:     dull TMs Nose:     dry Mouth:     erythema,edema Lungs:     Normal respiratory effort, chest expands symmetrically. Lungs are clear to auscultation, no crackles or wheezes. Cervical Nodes:     No lymphadenopathy noted Axillary Nodes:     No palpable lymphadenopathy    Impression & Recommendations:  Problem # 1:  bronchitis  Problem # 2:  sinusitis   Patient Instructions: 1)  Neti pot once daily-BID; Phenergan wCodeine cough syrup,Avelox samples X7 days; warning signs reviewed

## 2010-12-20 NOTE — Letter (Signed)
Summary: Primary Care Appointment Letter  [facility]  [address] [place], [place] [address]   Phone: [phone]  Fax: [phone]    10/28/2009 MRN: [id number]  [facility] [address] [place], [place]  [address]  Dear Ms. [facility],   Your Primary Care Physician Marga Melnick MD has indicated that:    ___x____it is time to schedule an appointment. Please call and schedula a fasting Physical appointment.    _______you missed your appointment on______ and need to call and          reschedule.    _______you need to have lab work done.    _______you need to schedule an appointment discuss lab or test results.    _______you need to call to reschedule your appointment that is                       scheduled on _________.     Please call our office as soon as possible. Our phone number is [phone]. Please press option 1. Our office is open 8a-12noon and 1p-5p, Monday through Friday.     Thank you,    [facility]

## 2010-12-20 NOTE — Letter (Signed)
Summary: Internal Correspondence--SLEEP STUDY  Internal Correspondence--SLEEP STUDY   Imported By: Freddy Jaksch 05/30/2007 10:43:07  _____________________________________________________________________  External Attachment:    Type:   Image     Comment:   SLEEP STUDY

## 2010-12-20 NOTE — Progress Notes (Signed)
Summary: PRIOR AUTH APPROVED FOR NEXIUM--[employer]  Phone Note Call from Patient   Caller: Patient Summary of Call: Dr.[name]  call back  [phone]  Pt is here in the office:::: she said a few weeks back her pharmacy faxed over a refill for her medication ( we didnt get the fax). pt is a little upset because she has not gotten her medication.  She want to know if Dr.[name] can write her a Rx while she is here. ( She is out of the mesication). Initial call taken by: Vanessa [place],  June 25, 2008 12:01 PM  Follow-up for Phone Call        Read letter pt bought in which was ADDRESSED TO HER, and states a review of YOUR coverage of this medication is due to expire, ASK YOUR DOCTOR TO CALL THE NUMBER LISTED ABOVE ([employer]) FOR ONGOING COVERAGE, IF YOUR RENEWAL REQUEST IS APPROVED BEFORE EXPIRATION DATE YOU WILL RECEIVE UNITERRUPTED COVERAGE FOR THE MEDICATION note pt bought this letter in today 06/25/08. pt given samples and autrhorization now in process Follow-up by: Kandice Hams,  June 25, 2008 12:14 PM  Additional Follow-up for Phone Call Additional follow up Details #1::        PRIOR AUTH APPROVED FOR NEXIUM FROM 06/25/08 TO 06/25/2009--  [employer] Additional Follow-up by: Kandice Hams,  July 15, 2008 10:34 AM

## 2010-12-20 NOTE — Medication Information (Signed)
Summary: Letter Concerning Renewal Information for Nexium/[employer]  Letter Concerning Renewal Information for Nexium/[employer]   Imported By: Lanelle Bal 07/17/2008 11:50:00  _____________________________________________________________________  External Attachment:    Type:   Image     Comment:   External Document

## 2010-12-20 NOTE — Progress Notes (Signed)
Summary: furosemide refill  Phone Note Refill Request Message from:  Fax from Pharmacy on March 16, 2008 4:30 PM  Refills Requested: Medication #1:  FUROSEMIDE 40 MG  TABS 1and 1/2 tabs daily for five days and then back to 1 tab daily office visit if swelling not improved [facility] [address]-[phone]  Initial call taken by: Doristine Devoid,  March 16, 2008 4:31 PM      Prescriptions: FUROSEMIDE 40 MG  TABS (FUROSEMIDE) 1and 1/2 tabs daily for five days and then back to 1 tab daily office visit if swelling not improved  #30 x 2   Entered by:   Wendall Stade   Authorized by:   Marga Melnick MD   Signed by:   Wendall Stade on 03/16/2008   Method used:   Electronically sent to ...       [facility]       [address]       [place], [place]  [address]       Ph: [phone] or [phone]       Fax: [phone]   RxID:   [id number]

## 2010-12-20 NOTE — Progress Notes (Signed)
Summary: med refill   Phone Note Refill Request   Refills Requested: Medication #1:  FUROSEMIDE 40 MG  TABS 1 by mouth qd  Medication #2:  GLIMEPIRIDE 4 MG  TABS 1/2 bid  Medication #3:  NEXIUM 40 MG  CPDR 1 twice daily the furosemide pt wanted to know if she can get the dose upped. pt would like to have a gernic of nexium(permexazide). pt would like to have a 90 day supply of these medications.  [phone] [facility] [address] . she is out of her fluid pill    Method Requested: Fax to Local Pharmacy Initial call taken by: Charolette Child,  October 03, 2007 11:19 AM  Follow-up for Phone Call        increase furosemide to 1 and 1/2 tab for five days and then return to one daily.  If swelling no better then needs office visit .  Also renewed other meds as requested patient called and notified Follow-up by: Wendall Stade,  October 04, 2007 6:34 PM    New/Updated Medications: FUROSEMIDE 40 MG  TABS (FUROSEMIDE) 1and 1/2 tabs daily for five days and then back to 1 tab daily office visit if swelling not improved   Prescriptions: GLIMEPIRIDE 4 MG  TABS (GLIMEPIRIDE) 1/2 bid  #90 x 1   Entered by:   Wendall Stade   Authorized by:   Marga Melnick MD   Signed by:   Wendall Stade on 10/04/2007   Method used:   Electronically sent to ...       [facility]       [address]       [place], [place]  [address]       Ph: [phone] or [phone]       Fax: [phone]   RxID:   [id number] NEXIUM 40 MG  CPDR (ESOMEPRAZOLE MAGNESIUM) 1 twice daily  #60 x 2   Entered by:   Wendall Stade   Authorized by:   Marga Melnick MD   Signed by:   Wendall Stade on 10/04/2007   Method used:   Electronically sent to ...       [facility]       [address]       [place], [place]  [address]       Ph: [phone] or [phone]       Fax: [phone]   RxID:   [id number] FUROSEMIDE 40 MG  TABS (FUROSEMIDE)  1and 1/2 tabs daily for five days and then back to 1 tab daily office visit if swelling not improved  #36 x 0   Entered by:   Wendall Stade   Authorized by:   Marga Melnick MD   Signed by:   Wendall Stade on 10/04/2007   Method used:   Electronically sent to ...       [facility]       [address]       [place], [place]  [address]       Ph: [phone] or [phone]       Fax: [phone]   RxID:   [id number]

## 2010-12-20 NOTE — Progress Notes (Signed)
Summary: Office Visit  Office Visit   Imported By: Freddy Jaksch 05/01/2007 10:09:20  _____________________________________________________________________  External Attachment:    Type:   Image     Comment:   PULMONARY

## 2010-12-20 NOTE — Assessment & Plan Note (Signed)
Summary: discuss lab,cbs   Vital Signs:  Patient Profile:   55 Years Old Female Weight:      239.38 pounds Pulse rate:   76 / minute Pulse rhythm:   regular BP sitting:   140 / 84  (left arm) Cuff size:   large  Pt. in pain?   no  Vitals Entered By: Wendall Stade (August 15, 2007 4:15 PM)                  Chief Complaint:  discuss labs and has questions about something she found on the internet and Type 2 diabetes mellitus follow-up.  History of Present Illness:  Type 2 Diabetes Mellitus Follow-Up      This is a 55 year old woman who presents for Type 2 diabetes mellitus follow-up.  FBS 105-122; pc < 160; occa tingling in hands & feet; occa postural sx. She maintains sticking to low carb, "sorta Atkin's". " I gained weight with Nutritionist".  The patient reports weight gain, but denies polyuria, polydipsia, blurred vision, self managed hypoglycemia, hypoglycemia requiring help, weight loss, and numbness of extremities.  Other symptoms include orthostatic symptoms.  The patient denies the following symptoms: neuropathic pain, chest pain, vomiting, poor wound healing, intermittent claudication, vision loss, and foot ulcer.  Since the last visit the patient reports good dietary compliance, compliance with medications, exercising regularly, and monitoring blood glucose.  The patient has been measuring capillary blood glucose before breakfast and after dinner.  Since the last visit, the patient reports having had eye care by an ophthalmologist and foot care by a podiatrist.   She experiences loss of words  occa.  Hypertension History:      She complains of headache, palpitations, dyspnea with exertion, peripheral edema, and neurologic problems, but denies chest pain, orthopnea, PND, visual symptoms, syncope, and side effects from treatment.  She notes no problems with any antihypertensive medication side effects.  Further comments include: BP1 16-121/ 74-78; frequent balance loss.      Positive major cardiovascular risk factors include diabetes and hypertension.  Negative major cardiovascular risk factors include female age less than 56 years old.     Current Allergies (reviewed today): No known allergies  Updated/Current Medications (including changes made in today's visit):  NEXIUM 40 MG  CPDR (ESOMEPRAZOLE MAGNESIUM) 1 twice daily ADVAIR DISKUS 250-50 MCG/DOSE MISC (FLUTICASONE-SALMETEROL) 1 puff bid FLONASE 50 MCG/ACT  SUSP (FLUTICASONE PROPIONATE) 1-2 sprays qd CYMBALTA 30 MG  CPEP (DULOXETINE HCL) 3 tabs qd SYNTHROID 50 MCG  TABS (LEVOTHYROXINE SODIUM) 1 by mouth qd * LAMICTIL 75MG  QD  CLORAZEPATE DIPOTASSIUM 15 MG  TABS (CLORAZEPATE DIPOTASSIUM) prn ADDERALL XR 30 MG  CP24 (AMPHETAMINE-DEXTROAMPHETAMINE) 2 tabs qam and 1 qpm FUROSEMIDE 40 MG  TABS (FUROSEMIDE) 1 by mouth qd * METFORMIN HCL1000 MG  TABS (METFORMIN HCL) 1 by mouth two times a day with 2 largest meals BUTALBITAL-APAP   TABS (BUTALBITAL-ACETAMINOPHEN TABS) prn * CPAP  * ALEVE 3 TABS BID  * MULTIVITAMIN  * VIT B12  * VIT D AND C  GLIMEPIRIDE 4 MG  TABS (GLIMEPIRIDE) 1/2 bid LISINOPRIL 20 MG  TABS (LISINOPRIL) 1 once daily if BP averages > 130/80      Review of Systems  Resp      Complains of hypersomnolence.      Denies morning headaches.      on CPAP with sleep apnea  MS      seeing Dr Corliss Skains for fibromyalgia; swelling with Lyrica  Neuro  Complains of difficulty with concentration, memory loss, poor balance, and tingling.      Denies numbness.      on Adderall   Physical Exam  General:     alert, well-developed, well-nourished, well-hydrated, normal appearance, and overweight-appearing.   Neck:     No deformities, masses, or tenderness noted. Lungs:     Normal respiratory effort, chest expands symmetrically. Lungs are clear to auscultation, no crackles or wheezes. Heart:     normal rate, regular rhythm, no gallop, no rub, no JVD, no HJR, and grade 1 /6 systolic  murmur.   Abdomen:     Bowel sounds positive,abdomen soft and non-tender without masses, organomegaly or hernias noted. Protuberant Pulses:     R and L carotid,radial,dorsalis pedis and posterior tibial pulses are full and equal bilaterally Extremities:     trace left pedal edema and trace right pedal edema.   Skin:     Intact without suspicious lesions or rashes Psych:     easily distracted and poor concentration.      Impression & Recommendations:  Problem # 1:  DIABETES-TYPE 2 (ICD-250.00)  Her updated medication list for this problem includes:    Glimepiride 4 Mg Tabs (Glimepiride) .Marland Kitchen... 1/2 bid    Lisinopril 20 Mg Tabs (Lisinopril) .Marland Kitchen... 1 once daily if bp averages > 130/80   Problem # 2:  HYPERTENSION, ESSENTIAL NOS (ICD-401.9)  The following medications were removed from the medication list:    Furosemide 40 Mg Tabs (Furosemide) .Marland Kitchen... Take once daily as needed  Her updated medication list for this problem includes:    Furosemide 40 Mg Tabs (Furosemide) .Marland Kitchen... 1 by mouth qd    Lisinopril 20 Mg Tabs (Lisinopril) .Marland Kitchen... 1 once daily if bp averages > 130/80   Problem # 3:  OBSTRUCTIVE SLEEP APNEA (ICD-327.23)  Problem # 4:  HYPOTHYROIDISM (ICD-244.9)  Her updated medication list for this problem includes:    Synthroid 50 Mcg Tabs (Levothyroxine sodium) .Marland Kitchen... 1 by mouth qd   Complete Medication List: 1)  Nexium 40 Mg Cpdr (Esomeprazole magnesium) .Marland Kitchen.. 1 twice daily 2)  Advair Diskus 250-50 Mcg/dose Misc (Fluticasone-salmeterol) .Marland Kitchen.. 1 puff bid 3)  Flonase 50 Mcg/act Susp (Fluticasone propionate) .Marland Kitchen.. 1-2 sprays qd 4)  Cymbalta 30 Mg Cpep (Duloxetine hcl) .... 3 tabs qd 5)  Synthroid 50 Mcg Tabs (Levothyroxine sodium) .Marland Kitchen.. 1 by mouth qd 6)  Lamictil 75mg  Qd  7)  Clorazepate Dipotassium 15 Mg Tabs (Clorazepate dipotassium) .... Prn 8)  Adderall Xr 30 Mg Cp24 (Amphetamine-dextroamphetamine) .... 2 tabs qam and 1 qpm 9)  Furosemide 40 Mg Tabs (Furosemide) .Marland Kitchen.. 1 by mouth  qd 10)  Metformin Hcl1000 Mg Tabs (metformin Hcl)  .Marland Kitchen.. 1 by mouth two times a day with 2 largest meals 11)  Butalbital-apap Tabs (Butalbital-acetaminophen tabs) .... Prn 12)  Cpap  13)  Aleve 3 Tabs Bid  14)  Multivitamin  15)  Vit B12  16)  Vit D and C  17)  Glimepiride 4 Mg Tabs (Glimepiride) .... 1/2 bid 18)  Lisinopril 20 Mg Tabs (Lisinopril) .Marland Kitchen.. 1 once daily if bp averages > 130/80  Hypertension Assessment/Plan:      The patient's hypertensive risk group is category C: Target organ damage and/or diabetes.  Her calculated 10 year risk of coronary heart disease is 13 %.  Today's blood pressure is 140/84.     Patient Instructions: 1)  Please schedule a follow-up appointment in 2 months. 2)  HbgA1C prior to visit, ICD-9: 250.02. BP  average = 130/80. Lisinopril 20 mg once daily if > 130/80.    Prescriptions: LISINOPRIL 20 MG  TABS (LISINOPRIL) 1 once daily if BP averages > 130/80  #90 x 1   Entered and Authorized by:   Marga Melnick MD   Signed by:   Marga Melnick MD on 08/15/2007   Method used:   Print then Give to Patient   RxID:   [id number] GLIMEPIRIDE 4 MG  TABS (GLIMEPIRIDE) 1/2 bid  #90 x 1   Entered and Authorized by:   Marga Melnick MD   Signed by:   Marga Melnick MD on 08/15/2007   Method used:   Print then Give to Patient   RxID:   [id number] METFORMIN HCL1000 MG  TABS (METFORMIN HCL) 1 by mouth two times a day with 2 largest meals  #180 x 1   Entered and Authorized by:   Marga Melnick MD   Signed by:   Marga Melnick MD on 08/15/2007   Method used:   Print then Give to Patient   RxID:   [id number]  ]

## 2010-12-20 NOTE — Assessment & Plan Note (Signed)
Summary: ANNUAL VISIT/MED RENEWAL                Toni Palmer    History of Present Illness Visit Type: Follow-up Visit Primary GI MD: Melvia Heaps MD [facility] Primary Provider: Marga Melnick, MD Chief Complaint: Annual visit- diverticulosis, med refills: Nexium & Reglan History of Present Illness:   Toni Palmer has returned for followup of her gastroparesis and reflux.  Symptoms are well-controlled with b.i.d. Nexium and full dose Reglan.  She has not tolerated lowering her Nexium.  She has not attempted to lower her Reglan dose.  Other medical problems include diabetes, sleep apnea and colon polyps.   GI Review of Systems    Reports abdominal pain, acid reflux, and  heartburn.     Location of  Abdominal pain: epigastric area.    Denies belching, bloating, chest pain, dysphagia with liquids, dysphagia with solids, loss of appetite, nausea, vomiting, vomiting blood, weight loss, and  weight gain.      Reports diverticulosis.     Denies anal fissure, black tarry stools, change in bowel habit, constipation, diarrhea, fecal incontinence, heme positive stool, hemorrhoids, irritable bowel syndrome, jaundice, light color stool, liver problems, rectal bleeding, and  rectal pain. Preventive Screening-Counseling & Management  Alcohol-Tobacco     Smoking Status: quit      Drug Use:  no.      Current Medications (verified): 1)  Nexium 40 Mg  Cpdr (Esomeprazole Magnesium) .Marland Kitchen.. 1 Twice Daily 2)  Ventolin Hfa 108 (90 Base) Mcg/act  Aers (Albuterol Sulfate) .... Inhale 2 Puffs Every 4 Hours As Needed 3)  Advair Diskus 250-50 Mcg/dose Misc (Fluticasone-Salmeterol) .Marland Kitchen.. 1 Puff Bid 4)  Flonase 50 Mcg/act  Susp (Fluticasone Propionate) .Marland Kitchen.. 1-2 Sprays Qd 5)  Cymbalta 30 Mg  Cpep (Duloxetine Hcl) .... 3 Tabs Qd 6)  Lamictil 75mg  Qd 7)  Clorazepate Dipotassium 15 Mg  Tabs (Clorazepate Dipotassium) .... Prn 8)  Adderall Xr 30 Mg  Cp24 (Amphetamine-Dextroamphetamine) .Marland Kitchen.. 1 Tabs Qam and 1 Qpm 9)  Furosemide  40 Mg  Tabs (Furosemide) .... Take 1 Tablet By Mouth Once A Day**office Vist Due Now** 10)  Cpap 12 Cwp 11)  Aleve 3 Tabs Bid 12)  Multivitamin 13)  Vit D and C 14)  Calcium With Vit D 15)  Metoclopramide Hcl 10 Mg Tabs (Metoclopramide Hcl) .Marland Kitchen.. 1 By Mouth Four Times A Day 30 Minutes Before Meals and At Bedtime  Allergies (verified): No Known Drug Allergies  Past History:  Past Medical History: Reviewed history from 03/30/2008 and no changes required. recently Dxed with sleep apnea GERD Anemia-NOS Colon Polyps Allergic Rhinitis Asthma Diabetes, Type 2 Sleep Apnea  Past Surgical History: Cosmetic surgery Knee Arthroscopy(L) Breast reduction  Family History: Reviewed history from 05/27/2007 and no changes required. Father: DM,CAD,COAD Mother: breast CA Siblings: sister CA  Social History: Occupation: Retired Patient is a former smoker.  Alcohol Use - no Illicit Drug Use - no Smoking Status:  quit Drug Use:  no  Review of Systems       The patient complains of allergy/sinus, anxiety-new, arthritis/joint pain, back pain, change in vision, depression-new, fatigue, skin rash, sleeping problems, and swelling of feet/legs.  The patient denies anemia, blood in urine, breast changes/lumps, confusion, cough, coughing up blood, fainting, fever, headaches-new, hearing problems, heart murmur, heart rhythm changes, itching, menstrual pain, muscle pains/cramps, night sweats, nosebleeds, pregnancy symptoms, shortness of breath, sore throat, swollen lymph glands, thirst - excessive , urination - excessive , urination changes/pain, urine leakage, vision  changes, and voice change.         All other systems were reviewed and were negative   Vital Signs:  Patient profile:   55 year old female Height:      65 inches Weight:      239.25 pounds BMI:     39.96 Pulse rate:   76 / minute Pulse rhythm:   regular BP sitting:   124 / 80  (left arm) Cuff size:   large  Vitals Entered By:  June McMurray CMA Duncan Dull) (May 27, 2010 9:47 AM)  Physical Exam  Additional Exam:  On physical exam she is an obese female  skin: anicteric HEENT: normocephalic; PEERLA; no nasal or pharyngeal abnormalities neck: supple nodes: no cervical lymphadenopathy chest: clear to ausculatation and percussion heart: no murmurs, gallops, or rubs abd: soft, nontender; BS normoactive; no abdominal masses, tenderness, organomegaly rectal: deferred ext: no cynanosis, clubbing, edema skeletal: no deformities neuro: oriented x 3; no focal abnormalities    Impression & Recommendations:  Problem # 1:  OVERWEIGHT (ICD-278.02) I discussed  consideration of bariatric surgery in view of her obesity and its related problems including her diabetes, gastroparesis, GERD and sleep apnea.  She is agreeable to pursuing this.  Problem # 2:  GASTROPARESIS (ICD-536.3) I we will try to wean and ultimately discontinue Reglan because of the potential side effects.  If this is unsuccessful I will try substituting erythromycin or domperidone  Problem # 3:  GERD (ICD-530.1) Plan to continue b.i.d. Nexium  Problem # 4:  PERSONAL HISTORY OF COLONIC POLYPS (ICD-V12.72) Plan followup colonoscopy in 2014  Patient Instructions: 1)  Copy sent to : Marga Melnick, MD 2)  Reduce Reglan as explained by Dr Arlyce Dice 3)  We will renew your Nexium today 4)  The medication list was reviewed and reconciled.  All changed / newly prescribed medications were explained.  A complete medication list was provided to the patient / caregiver.

## 2010-12-20 NOTE — Letter (Signed)
Summary: Results Follow up Letter  [facility]  [address] [place], [place] [address]   Phone: [phone]  Fax: [phone]    08/12/2007 MRN: [id number]  KEIRRA ZEIMET [address] [place], [place]  [address]  Dear Ms. Hunsinger,  The following are the results of your recent test(s):  Test         Result    Pap Smear:        Normal _____  Not Normal _____ Comments: ______________________________________________________ Cholesterol: LDL(Bad cholesterol):         Your goal is less than:         HDL (Good cholesterol):       Your goal is more than: Comments:  ______________________________________________________ Mammogram:        Normal _____  Not Normal _____ Comments:  ___________________________________________________________________ Hemoccult:        Normal _____  Not normal _______ Comments:    _____________________________________________________________________ Other Tests:  Please see attached results and comments   We routinely do not discuss normal results over the telephone.  If you desire a copy of the results, or you have any questions about this information we can discuss them at your next office visit.   Sincerely,

## 2010-12-20 NOTE — Letter (Signed)
Summary: [facility]   Imported By: Lanelle Bal 02/07/2010 14:05:37  _____________________________________________________________________  External Attachment:    Type:   Image     Comment:   External Document

## 2010-12-20 NOTE — Progress Notes (Signed)
Summary: [name]--QUESTION ABOUT HER MED   Phone Note Call from Patient Call back at Work Phone [phone]   Caller: Patient Summary of Call: PT CALLING WITH QUESTION ABOUT HER  GLIMEPIRIDE AND FURSEMIDE WOULD LIKE FOR KATHY TO CALL HER BACK Initial call taken by: Freddy Jaksch,  November 07, 2007 11:39 AM  Follow-up for Phone Call        spoke with patient , she needs new script for furosemide, I told her she needs to go back to one tablet daily and not continue the one and a half.  Also she had stopped the glimiperide and the nausea is gone but blood sugars are up.  Dr hopper is referring her to dr Everardo All and I will call in the furosemide to [facility] [address].....................................................................Marland KitchenWendall Stade  November 08, 2007 1:16 PM       Prescriptions: FUROSEMIDE 40 MG  TABS (FUROSEMIDE) 1and 1/2 tabs daily for five days and then back to 1 tab daily office visit if swelling not improved  #30 x 2   Entered by:   Wendall Stade   Authorized by:   Marga Melnick MD   Signed by:   Wendall Stade on 11/08/2007   Method used:   Electronically sent to ...       [facility]*       [address]       [place], [place]  [address]       Ph: [phone] or [phone]       Fax: [phone]   RxID:   [id number]

## 2010-12-20 NOTE — Letter (Signed)
Summary: Toni Palmer  [facility]   Imported By: Freddy Jaksch 12/23/2007 16:43:09  _____________________________________________________________________  External Attachment:    Type:   Image     Comment:   External Document

## 2010-12-22 NOTE — Progress Notes (Signed)
Summary: Medication ?s   Phone Note Call from Patient Call back at Home Phone [phone]   Caller: Patient Call For: Dr. Arlyce Dice Reason for Call: Talk to Nurse Summary of Call: Dr. Arlyce Dice is changing the patients medication and she wants to get some further information on the new medication Initial call taken by: [place] Johnson,  December 02, 2010 4:04 PM  Follow-up for Phone Call        Patient is concerned and has questions about Domperidone. Dr Arlyce Dice patient would like to know: Is there a link to Breast Cancer?  patient with family hx of Breast Cancer. How long has this drug been used in [place]? Why hasn't the FDA approved the drug here? Thanks,  Graciella Freer RN  December 02, 2010 4:23 PM   Additional Follow-up for Phone Call Additional follow up Details #1::        No link to breast Ca.  Has been used for years in [place]  I'm not sure why it has not been approved in [place]. She should google the drug to see why it has not been approved in [place] Additional Follow-up by: Louis Meckel MD,  December 05, 2010 4:11 PM    Additional Follow-up for Phone Call Additional follow up Details #2::    Notified patient of Dr Marzetta Board note/findings. Patient stated understanding. Follow-up by: Graciella Freer RN,  December 05, 2010 4:21 PM

## 2010-12-22 NOTE — Progress Notes (Signed)
  Medications Added * DOMPERIDONE 10MG  take 1/2 tab ac and hs       ---- Converted from flag ---- ---- 11/29/2010 10:15 AM, Louis Meckel MD wrote: please d/c reglan and begin domperidone 10mg  1/2 ac and hs ------------------------------       Additional Follow-up for Phone Call Additional follow up Details #2::    Spoke with patient and she knows to stop the Reglan when she gets the Domperidone. Instructed patient to call me back in a couple of days if she has not heard back from the pharmacy. Follow-up by: Selinda Michaels RN,  November 29, 2010 2:45 PM  New/Updated Medications: * DOMPERIDONE 10MG  take 1/2 tab ac and hs Prescriptions: DOMPERIDONE 10MG  take 1/2 tab ac and hs  #60 x 3   Entered by:   Selinda Michaels RN   Authorized by:   Louis Meckel MD   Signed by:   Selinda Michaels RN on 11/29/2010   Method used:   Faxed to ...       [facility]* (retail)       [address]       [place], [place]  [address]       Ph: [phone]       Fax: [phone]   RxID:   [id number]

## 2010-12-22 NOTE — Progress Notes (Signed)
 ----   Converted from flag ---- ---- 11/24/2010 12:05 PM, Louis Meckel MD wrote: Please inquire whether she has attempted lowering or d/cing her reglan.  The goal is to get her off the med.  If unable to do so, I would like to try domperidone. ------------------------------     Additional Follow-up for Phone Call Additional follow up Details #1::       Additional Follow-up by: Louis Meckel MD,  November 29, 2010 10:14 AM    Additional Follow-up for Phone Call Additional follow up Details #2::    Left message to call back Selinda Michaels, RN  Left message to call back Selinda Michaels, RN 11/25/10 @2 :26pm  Spoke with patient and she states that she has tried to come off of the Reglan but it wasn't working, she started having problems. She is currently taking Reglan 10mg  4 times a day. Informed her that Dr. Arlyce Dice would like for her to be off of the Reglan and he may want to start her on Domperidone. Let patient know I would check with Dr. Arlyce Dice and call her back. Dr. Arlyce Dice please advise. Follow-up by: Selinda Michaels RN,  November 28, 2010 2:36 PM

## 2011-02-13 ENCOUNTER — Other Ambulatory Visit: Payer: Self-pay | Admitting: Gastroenterology

## 2011-02-13 NOTE — Telephone Encounter (Signed)
Spoke with pt and she did not get the Domperidone the first time the rx was sent in for her. Called in the script again to the pharmacy in [place]. Pt will be called by the pharmacy regarding shipping of the drug. Pt aware.

## 2011-04-04 NOTE — Assessment & Plan Note (Signed)
[facility]                             PULMONARY OFFICE NOTE   Toni Palmer, Toni Palmer                     MRN:          [id number]  DATE:04/23/2007                            DOB:          [birth year]    SLEEP MEDICINE CONSULTATION:   PROBLEM:  A 55 year old woman seen on kind referral of Dr. Pollyann Savoy in sleep medicine consultation, concerned about sleep apnea.   HISTORY:  She had a nocturnal polysomnogram done on March 20, 2007,  demonstrating severe obstructive sleep apnea syndrome with an AHI of  51.3 events per hour, moderate to loud snoring with desaturation to 65%.  CPAP titration was not scheduled.  She had noticed increased drowsiness  while taking Lyrica and she was oversleeping in the mornings and  fighting drowsiness while driving.  She lives alone and is not being  told about obvious snoring or witnessed apneas.  She had been taking  Ritalin and then Adderall trying to improve her concentration.  She  complains of poor memory but accepts that all of that may be related to  tiredness.  Typical bedtime is between 11 p.m. and 1 a.m., but she says  she falls asleep every day after work.  Usual sleep latency at bedtime  is 20 minutes, sometimes waking once during the night before finally up  at 5:30 a.m.  Her weight has fluctuated.   MEDICATIONS:  1. Multivitamin.  2. Vitamin C.  3. Nexium.  4. Advair 250/50 mcg.  5. Flonase.  6. Cymbalta 60 mg.  7. Synthroid 50 mcg.  8. Lamictal.  9. Adderall, uncertain tablet strength, taking 2 daily.  10.Occasional use of Proventil inhaler.  11.Trazodone.  12.Fiorinal for back pain.  13.Tranxene.   No medication allergy.   REVIEW OF SYSTEMS:  As per HPI.  Nonrestorative sleep.  Daytime naps.  Some sleep-talking.  Depression has been treated and is better.  Weight  has changed by 40 pounds up and down in the last few years.   PAST HISTORY:  1. Fibromyalgia.  2. Asthma.  3.  Diabetes.  4. Allergic rhinitis.  5. Treatment for bone spur.  6. Knee surgery.  7. Evaluated for allergic rhinitis and asthma in 2007.  Did not follow      through for allergy skin testing.  At that time gave history of      breast reduction as an additional surgery.   SOCIAL HISTORY:  Quit smoking 20 years ago.  Divorced with children.  Seeing a nutritionalist because of her diabetes.   FAMILY HISTORY:  Allergies, asthma.  Nobody known to have sleep apnea.   OBJECTIVE:  VITAL SIGNS:  Height 5 feet 4 inches, weight 123 pounds.  BP  118/62, pulse 69, room air saturation 98%.  GENERAL:  This is an overweight woman.  HEENT:  Narrow nasal airway, frequent sniffing.  Palate spacing 3/4 with  no erythema.  Voice quality is normal.  NECK:  No stridor or thyromegaly.  CHEST:  Quiet, clear lung sounds.  CARDIAC:  Heart sounds are regular without murmur or gallop.   IMPRESSION:  1.  Obstructive sleep apnea, index 51.3 per hour.  2. Exogenous obesity.  3. Allergic rhinitis.  4. Mild asthma.  5. Fibromyalgia.   PLAN:  We discussed the physiology, medical concerns and available  treatments for sleep apnea.  I emphasized the importance of her weight  loss as well as her responsibility to be alert for safe driving.  We  reviewed basics of sleep hygiene.  We are scheduling CPAP titration and  she will return after that has been completed.   I appreciate the chance to see her.     Clinton D. Maple Hudson, MD, Tonny Bollman, FACP  Electronically Signed    CDY/MedQ  DD: 04/24/2007  DT: 04/25/2007  Job #: [id number]   cc:   Pollyann Savoy, M.D.  Titus Dubin. Alwyn Ren, MD,FACP,FCCP

## 2011-04-04 NOTE — Assessment & Plan Note (Signed)
[facility]                             PULMONARY OFFICE NOTE   Toni Palmer, Toni Palmer                     MRN:          [id number]  DATE:05/27/2007                            DOB:          December 15, 1955    PROBLEM:  1. Obstructive sleep apnea.  2. Allergic rhinitis.  3. Asthma.  4. Exogenous obesity.   HISTORY:  CPAP titrated to 12 CWP and she has felt comfortable with  that, noticing less daytime sleepiness.  We have talked about CPAP  comfort, sleep hygiene, the importance of weight loss and safe driving  and alternatives to CPAP.   MEDICATIONS:  She remains off Concerta and Ritalin, using Adderall 2 in  the morning, 1 in the evening.  She is not sure of her dose.  She  continues Advair 250/50 recognizing additional stimulant and she has a  p.r.n. Proventil inhaler, not used often.   ALLERGIES:  No medication allergy.   OBJECTIVE:  VITAL SIGNS:  Weight 223 pounds, blood pressure 126/78,  pulse regular at 81, room air saturation 98%.  GENERAL:  She is obese, alert.  There are no pressure marks on her face  from the mask and no nasal obstruction.   IMPRESSION:  1. Obstructive sleep apnea.  Should do well with a fixed CPAP pressure      of 12.  2. Excessive daytime somnolence, taking a couple of medications with      stimulant effect as noted.  3. Asthma.  4. Allergic rhinitis.   PLAN:  We will have [facility] set her at fixed CPAP 12 CWP.  Schedule return four months or earlier p.r.n.     Clinton D. Maple Hudson, MD, [facility] Bollman, FACP  Electronically Signed    CDY/MedQ  DD: 06/15/2007  DT: 06/17/2007  Job #: [id number]   cc:   Pollyann Savoy, M.D.  Titus Dubin. Alwyn Ren, MD,FACP,FCCP

## 2011-04-04 NOTE — Procedures (Signed)
[name], Toni Palmer              ACCOUNT NO.:  [id number]   MEDICAL RECORD NO.:  [id number]          PATIENT TYPE:  OUT   LOCATION:  SLEEP CENTER                 FACILITY:  [facility]   PHYSICIAN:  Clinton D. Maple Hudson, MD, FCCP, FACPDATE OF BIRTH:  1955-11-10   DATE OF STUDY:  03/20/2007                            NOCTURNAL POLYSOMNOGRAM   REFERRING PHYSICIAN:  SHAILI B DEVESHWAR   INDICATION FOR STUDY:  Insomnia with sleep apnea.   EPWORTH SLEEPINESS SCORE:  16/24, BMI 38.3, weight 225 pounds.   MEDICATIONS:  Home medications listed and reviewed.  Those possibly  affecting sleep include Cymbalta, Synthroid, Adderall, Advair, and  Trazodone.   SLEEP ARCHITECTURE:  Total sleep time 338 minutes with sleep efficiency  93%.  Stage I was 2%, stage II 90%, stages III and IV absent, REM 7% of  total sleep time.  Sleep latency 4 minutes, REM latency 329 minutes,  awake after sleep onset 21 minutes.  Arousal index 15.1.  Trazodone 1/2  of a 150 mg tablet taken at 10:20 p.m.   RESPIRATORY DATA:  Diagnostic NPSG protocol as ordered.  Apnea/hypopnea  index (AHI, RDI) 51.3 obstructive events per hour indicating severe  obstructive sleep apnea/hypopnea syndrome.  There were 227 obstructive  apneas and 62 hypopneas.  Most sleep and, therefore, most events were  recorded while supine.  REM AHI 86.4.   OXYGEN DATA:  Moderate to loud snoring with oxygen desaturation to a  nadir of 65%.  Mean oxygen saturation through the study was 93% on room  air.   CARDIAC DATA:  Normal sinus rhythm.   MOVEMENT-PARASOMNIA:  No significant limb jerk or motor disturbance.   IMPRESSIONS-RECOMMENDATIONS:  1. Severe obstructive sleep apnea/hypopnea syndrome, AHI 51.3 per      hour.  Most events and sleep were while supine.  Moderate to loud      snoring with oxygen desaturation to a nadir of 65%.  2. Consider return for CPAP titration or evaluation for alternative      therapy as appropriate.  Consultation is  available if desired.      Clinton D. Maple Hudson, MD, [facility], FACP  Diplomate, Biomedical engineer of Sleep Medicine  Electronically Signed     CDY/MEDQ  D:  03/24/2007 11:12:02  T:  03/24/2007 17:36:16  Job:  [id number]

## 2011-04-04 NOTE — Assessment & Plan Note (Signed)
[facility]                         GASTROENTEROLOGY OFFICE NOTE   Toni Palmer, Toni Palmer                     MRN:          [id number]  DATE:01/09/2008                            DOB:          12-03-1955    PROBLEM:  1. Anemia.  2. Colon polyps.  3. Gastroparesis.   REASON:  Toni Palmer has returned following colonoscopy.  Colonoscopy  demonstrated a diminutive adenomatous polyp and diverticulosis.  A  gastric emptying scan was performed because of nausea and vomiting.  This demonstrated significant delay.  Since starting Reglan, these  symptoms have entirely subsided.  Altogether she is feeling well.  She  submitted stool cards, though results are not yet available.   EXAMINATION:  Pulse 80, blood pressure 122/70, weight 247.   IMPRESSION:  1. Anemia of chronic disease.  2. Colon polyps.  3. Diverticulosis.  4. Gastroparesis.   RECOMMENDATIONS:  1. Assuming that her Hemoccults are negative, I would not pursue her      gastrointestinal workup any further.  2. Followup colonoscopy in five years.  3. Continue Reglan.     Barbette Hair. Arlyce Dice, MD,FACG  Electronically Signed    RDK/MedQ  DD: 01/09/2008  DT: 01/09/2008  Job #: [id number]   cc:   Titus Dubin. Alwyn Ren, MD,FACP,FCCP

## 2011-04-04 NOTE — Assessment & Plan Note (Signed)
[facility]                             PULMONARY OFFICE NOTE   BAYA, LENTZ                     MRN:          [id number]  DATE:09/30/2007                            DOB:          1956/02/14    PROBLEM:  1. Obstructive sleep apnea.  2. Allergic rhinitis.  3. Asthma.  4. Exogenous obesity.  5. Fibromyalgia.   HISTORY:  Says she has done well with CPAP at 12 CWP.  She is retired  from teaching, and sleeps better now.  She blames allergies for frontal  and maxillary headaches without nasal discharge or sneeze.  Asks if we  can try butalbital/Fiorinal for headache relief, since this worked in  the past.  I discussed this in the context of her sleep problems, and  also appropriate use of the medication.  Dr. Evelene Croon treats Adderall for  fibromyalgia, which has been helpful.  The patient has joined a gym.  No  recent flare of her respiratory complaints, other than the headache.   MEDICATIONS:  1. Multivitamins.  2. Nexium.  3. Advair 250/50.  4. Flonase.  5. Cymbalta 60 mg.  6. Synthroid 50 mcg.  7. Lamictal.  8. Adderall unknown dose.  9. Metformin 1000 mg b.i.d.  10.Proventil rescue inhaler.  11.She has used trazodone for sleep.   NO MEDICATION ALLERGY.   OBJECTIVE:  Weight 246 pounds.  BP 124/78.  Pulse 92.  Room air  saturation 100%.  Obese.  Alert.  In no evident distress.  No periorbital edema.  No conjunctival injection or adenopathy.  There  is turbinate edema bilaterally with clear mucus.  Pharynx is clear.  LUNGS:  Clear.  Pulse regular.  No tremor.   IMPRESSION:  1. Obstructive sleep apnea, doing well on CPAP at 12 CWP.  She is      going to need to work harder on getting her weight down, and      exercise as discussed.  2. Asthma.  Controlled.  3. Rhinitis.  I said if episodic use of muscle tension reliever did      not easily control the headache, we would need to get a limited CT      scan of the sinuses, and we  discussed use of Fiorinal.   PLAN:  1. Fiorinal number 30, one q.6 hours p.r.n.  2. Continue CPAP at 12 CWP.  3. Schedule return in 6 months, but earlier p.r.n.     Clinton D. Maple Hudson, MD, Ton[facility] Bollman, FACP  Electronically Signed    CDY/MedQ  DD: 09/30/2007  DT: 10/01/2007  Job #: [id number]   cc:   Pollyann Savoy, M.D.  Titus Dubin. Alwyn Ren, MD,FACP,FCCP  Milagros Evener, M.D.

## 2011-04-04 NOTE — Assessment & Plan Note (Signed)
Toni Palmer Palmer                         GASTROENTEROLOGY OFFICE NOTE   Toni Palmer, Palmer                     MRN:          [id number]  DATE:11/04/2007                            DOB:          1956-02-12    REASON FOR CONSULTATION:  Anemia.   HISTORY:  Toni Palmer Palmer is a pleasant 55 year old white female referred  through the courtesy of Dr. Alwyn Ren for evaluation.  Routine testing  demonstrated a mild anemia.  On October 02, 2007, hemoglobin was 11.2,  white count 6.4, MCV 86, B12 level was normal, serum iron was 54.  Ms.  Mceuen denies change of bowel habits, melena or hematochezia.  Her  main GI complaint is pyrosis which is well controlled with Nexium.  She  frequently has dry heaves that occur 30-60 minutes postprandially.  She  notes that her nausea and vomiting began when she initiated Glyburide  initially at a mistakenly high dose.  Since lowering the dose of  Glyburide, she still continues to complain of dry heaves.  She is  without abdominal pain.   PAST MEDICAL HISTORY:  1. Pertinent for diabetes.  2. Thyroid disease.  3. Asthma.  4. She has sleep apnea.  5. She is status post breast reduction surgery.  6. Knee surgery.   FAMILY HISTORY:  Pertinent for mother and sister with breast cancer.   MEDICATIONS:  1. Advair.  2. Flonase.  3. Cymbalta.  4. Synthroid.  5. Lamictal.  6. Adderall.  7. Metformin.  8. Glyburide.   ALLERGIES:  NONE.   SOCIAL HISTORY:  She neither smokes nor drinks.  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   REVIEW OF SYSTEMS:  Positive for joint pains, sleeping problems, night  sweats, anxiety and fatigue.   PHYSICAL EXAMINATION:  VITAL SIGNS:  Pulse 60, blood pressure 110/70,  weight 241.  HEENT:  EOMI.  PERRLA.  Sclerae are anicteric.  Conjunctivae are pink.  NECK:  Supple without thyromegaly, adenopathy or carotid bruits.  CHEST:  Clear to auscultation and percussion without  adventitious  sounds.  CARDIAC:  Regular rhythm; normal S1 S2.  There are no murmurs, gallops  or rubs.  ABDOMEN:  Bowel sounds are normoactive.  Abdomen is soft, nontender and  nondistended.  There are no abdominal masses, tenderness, splenic  enlargement or hepatomegaly.  EXTREMITIES:  Full range of motion.  No cyanosis, clubbing or edema.  RECTAL:  Deferred.   IMPRESSION:  1. Normochromic normocytic anemia.  I suspect she has an anemic of      chronic disease.  Chronic gastrointestinal blood loss will be ruled      out.  2. Nausea and vomiting.  This is temporarily related to Glyburide      raising the question of drug side effect.  Gastroparesis is also a      concern.  3. Diabetes.  4. Gastroesophageal reflux disease.   RECOMMENDATION:  1. Colonoscopy.  2. Serial hemoccults.  3. Hold Glypride for several days.  This will be done under the      direction of Dr. Alwyn Ren.  4.  If symptoms have  not improved, then I      would consider upper endoscopy and possible gastric emptying scan.     Toni Palmer Palmer. Toni Palmer Dice, MD,FACG  Electronically Signed    RDK/MedQ  DD: 11/04/2007  DT: 11/05/2007  Job #: [id number]   cc:   Titus Dubin. Alwyn Ren, MD,FACP,FCCP

## 2011-04-06 ENCOUNTER — Telehealth: Payer: Self-pay | Admitting: Gastroenterology

## 2011-04-06 NOTE — Telephone Encounter (Signed)
OK to refer to surgical group for bariatric surgery

## 2011-04-06 NOTE — Telephone Encounter (Signed)
Pt states Dr. Arlyce Dice had suggested that she have lap band surgery in the past. She is ready to do this now but needs a referral from Dr. Arlyce Dice for the surgery. Dr. Arlyce Dice please advise.

## 2011-04-07 NOTE — Op Note (Signed)
NAMETASHIMA, SCARPULLA              ACCOUNT NO.:  [id number]   MEDICAL RECORD NO.:  [id number]          PATIENT TYPE:  AMB   LOCATION:  [facility]                          FACILITY:  [facility]   PHYSICIAN:  Harvie Junior, M.D.   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   PROCEDURE:  1.  Partial posterior horn medial meniscectomy with corresponding      debridement of the medial femoral condyle.  2.  Debridement of the chondromalacia of the patella by way of      chondroplasty.   SURGEON:  Harvie Junior, M.D.   ASSISTANT:  None.   ANESTHESIA:  General.   BRIEF HISTORY:  55 year old female with a long history of having catching  and locking on the in board side of her knee.  She ultimately was evaluated  in the office and felt to have a medial meniscal tear.  MRI was obtained  which showed that she did have a medial meniscal tear and chondromalacia of  the patella.  She ultimately was taken to the operating room after failure  of conservative care for treatment of her medial meniscus tear.   DESCRIPTION OF PROCEDURE:  The patient was taken to the operating room and  after adequate anesthesia was obtained with general anesthesia, the patient  was placed on the operating table, the right leg was prepped and draped in  the usual sterile fashion.  Following this, routine arthroscopic examination  of the knee revealed there was an obvious posterior horn medial meniscal  tear.  The medial meniscus was trimmed with a combination of straight biting  forceps and upbiting forceps and the remaining meniscal rim was smoothed  down with a suction shaver.  Interestingly, there was a very large posterior  horn medial meniscal tear, much greater  than what we suspected by MRI, with  flaps anteriorly and posteriorly, we probably took out 20% of the medial  meniscus in that posterior portion.  Once this was debrided, a probe was  used to make sure there was a smooth and stable rim posteriorly and there  was.  At this point, attention was turned to the medial femoral condyle  which had some grade 2 chondromalacia which was debrided.  Following this,  the ACL was examined and noted to be normal, the lateral side normal.  Attention was turned to the patellofemoral joint where there was some grade  2 and 3 changes in the trochlea.  This was debrided with the suction shaver  from both the medial and lateral portals.  At that point, a final check was  made for any loosened fragment or pieces.  Seeing none, the knee was  copiously irrigated and suctioned dry.  The arthroscopic portals were closed  with a bandage.  A sterile compressive dressing was applied.  The patient  was taken to  the recovery room and was noted to be in satisfactory  condition.  Prior to stopping the case, 20 mL of 0.25% Marcaine was  instilled in the knee for postop anesthesia.  The estimated blood loss for  the procedure was none.      Harvie Junior, M.D.  Electronically Signed     JLG/MEDQ  D:  10/09/2005  T:  10/09/2005  Job:  [id number]

## 2011-04-10 NOTE — Telephone Encounter (Signed)
Spoke with [facility] and they state that she has to attend a weight loss seminar first and then go from there to schedule the appointment. Pt is to call [phone] to attend this seminar. Left message for pt to call back.

## 2011-04-11 ENCOUNTER — Telehealth: Payer: Self-pay

## 2011-04-11 NOTE — Telephone Encounter (Signed)
Pt knows to call [phone] and attend a weight loss seminar to start the process for the lap band surgery. Pt verbalized understanding.

## 2011-05-10 ENCOUNTER — Telehealth: Payer: Self-pay | Admitting: Internal Medicine

## 2011-05-10 NOTE — Telephone Encounter (Signed)
Per CY-okay to see TP tomorrow and then follow up with him. Pt has appt at 3pm on 05-11-11 to see TP and 06-15-11 at 930am to see CY.

## 2011-05-11 ENCOUNTER — Encounter: Payer: Self-pay | Admitting: Adult Health

## 2011-05-11 ENCOUNTER — Ambulatory Visit (INDEPENDENT_AMBULATORY_CARE_PROVIDER_SITE_OTHER): Payer: BC Managed Care – PPO | Admitting: Adult Health

## 2011-05-11 VITALS — BP 130/70 | HR 84 | Temp 97.6°F | Ht 64.0 in | Wt 254.8 lb

## 2011-05-11 DIAGNOSIS — J45909 Unspecified asthma, uncomplicated: Secondary | ICD-10-CM

## 2011-05-11 MED ORDER — AMOXICILLIN-POT CLAVULANATE 875-125 MG PO TABS: 1.0000 | ORAL_TABLET | Freq: Two times a day (BID) | ORAL | Status: AC

## 2011-05-11 MED ORDER — LEVALBUTEROL HCL 0.63 MG/3ML IN NEBU
0.6300 mg | INHALATION_SOLUTION | Freq: Once | RESPIRATORY_TRACT | Status: AC
  Administered 2011-05-11: 0.63 mg via RESPIRATORY_TRACT

## 2011-05-11 MED ORDER — ALBUTEROL SULFATE HFA 108 (90 BASE) MCG/ACT IN AERS: 2.0000 | INHALATION_SPRAY | Freq: Four times a day (QID) | RESPIRATORY_TRACT | Status: DC | PRN

## 2011-05-11 MED ORDER — FLUTICASONE-SALMETEROL 250-50 MCG/DOSE IN AEPB: 1.0000 | INHALATION_SPRAY | Freq: Two times a day (BID) | RESPIRATORY_TRACT | Status: DC

## 2011-05-11 MED ORDER — FLUTICASONE PROPIONATE 50 MCG/ACT NA SUSP: 2.0000 | Freq: Every day | NASAL | Status: DC

## 2011-05-11 NOTE — Progress Notes (Signed)
Addended by: Boone Master E on: 05/11/2011 05:41 PM   Modules accepted: Orders

## 2011-05-11 NOTE — Assessment & Plan Note (Addendum)
Flare with associated Sinusitis  Plan:  Augmentin 875mg  Twice daily  For 10 days  Mucinex DM Twice daily  As needed  Cough/congestion  Fluids and rest  Restart Advair 250/50 1 puff Twice daily  , Flonase 2 puffs Twice daily   follow up Dr. Maple Hudson  In 4 weeks and As needed   Please contact office for sooner follow up if symptoms do not improve or worsen or seek emergency care  May use Allegra 180mg  daily in place of claritin once finished with augmentin.

## 2011-05-11 NOTE — Progress Notes (Signed)
  Subjective:    Patient ID: Toni Palmer, female    DOB: 09/13/1956, 55 y.o.   MRN: [id number]  HPI 89 WF with known hx of Asthma, AR, and OSA  05/11/11 Acute OV  Pt presents for an acute work in visit. Complains of prod cough with yellow/green mucus, increased SOB, wheezing, sinus pressue/congestion, chills/sweats x1.5weeks . Has teeth pain with bending forward. Lots of sinus congestion and drainage. She was previous on  on advair , flonase  Stopped several months ago. Would like to restart. Needs new rx. Congestion is getting worse.   Pt informs me she is currently undergoing preliminary workup for Lap Band surgery.    Review of Systems Constitutional:   No  weight loss, night sweats,  Fevers, chills, fatigue, or  lassitude.  HEENT:   No headaches,  Difficulty swallowing,  Tooth/dental problems, or  Sore throat,                No sneezing, itching, ear ache,  +nasal congestion, post nasal drip,   CV:  No chest pain,  Orthopnea, PND, swelling in lower extremities, anasarca, dizziness, palpitations, syncope.   GI  No heartburn, indigestion, abdominal pain, nausea, vomiting, diarrhea, change in bowel habits, loss of appetite, bloody stools.   Resp:   No coughing up of blood.   No chest wall deformity  Skin: no rash or lesions.  GU: no dysuria, change in color of urine, no urgency or frequency.  No flank pain, no hematuria   MS:  No joint pain or swelling.  No decreased range of motion.  No back pain.  Psych:  No change in mood or affect. No depression or anxiety.  No memory loss.         Objective:   Physical Exam GEN: A/Ox3; pleasant , NAD, obese   HEENT:  [place]/AT,  EACs-clear, TMs-wnl, NOSE-clear drainage-max sinus tenderness, THROAT-clear, no lesions, no postnasal drip or exudate noted.   NECK:  Supple w/ fair ROM; no JVD; normal carotid impulses w/o bruits; no thyromegaly or nodules palpated; no lymphadenopathy.  RESP  Coarse BS w/ coarse BS   w/o, wheezes/ rales/ or  rhonchi.no accessory muscle use, no dullness to percussion  CARD:  RRR, no m/r/g  , no peripheral edema, pulses intact, no cyanosis or clubbing.  GI:   Soft & nt; nml bowel sounds; no organomegaly or masses detected.  Musco: Warm bil, no deformities or joint swelling noted.   Neuro: alert, no focal deficits noted.    Skin: Warm, no lesions or rashes          Assessment & Plan:

## 2011-05-11 NOTE — Patient Instructions (Addendum)
Augmentin 875mg  Twice daily  For 10 days  Mucinex DM Twice daily  As needed  Cough/congestion  Fluids and rest  Restart Advair 250/50 1 puff Twice daily  , Flonase 2 puffs Twice daily   follow up Dr. Maple Hudson  In 4 weeks and As needed   Please contact office for sooner follow up if symptoms do not improve or worsen or seek emergency care  May use Allegra 180mg  daily in place of claritin once finished with augmentin.

## 2011-06-06 ENCOUNTER — Telehealth: Payer: Self-pay | Admitting: Gastroenterology

## 2011-06-07 MED ORDER — METOCLOPRAMIDE HCL 10 MG PO TBDP: 1.0000 | ORAL_TABLET | Freq: Four times a day (QID) | ORAL | Status: DC

## 2011-06-07 NOTE — Telephone Encounter (Signed)
Pt states that she is not feeling good, states the domperidone is not working good for her. She states the last 2 weeks have been really bad. She reports that the reglan was working well and she felt good taking the reglan. Dr. Arlyce Dice please advise.

## 2011-06-07 NOTE — Telephone Encounter (Signed)
OK to switch back to reglan

## 2011-06-07 NOTE — Telephone Encounter (Signed)
Pt aware of Dr. Kaplan's recommendations. 

## 2011-06-15 ENCOUNTER — Ambulatory Visit (INDEPENDENT_AMBULATORY_CARE_PROVIDER_SITE_OTHER): Payer: BC Managed Care – PPO | Admitting: Internal Medicine

## 2011-06-15 ENCOUNTER — Encounter: Payer: Self-pay | Admitting: Internal Medicine

## 2011-06-15 VITALS — BP 122/76 | HR 80 | Ht 64.0 in | Wt 241.4 lb

## 2011-06-15 DIAGNOSIS — J45909 Unspecified asthma, uncomplicated: Secondary | ICD-10-CM

## 2011-06-15 DIAGNOSIS — H6980 Other specified disorders of Eustachian tube, unspecified ear: Secondary | ICD-10-CM | POA: Insufficient documentation

## 2011-06-15 DIAGNOSIS — J309 Allergic rhinitis, unspecified: Secondary | ICD-10-CM

## 2011-06-15 DIAGNOSIS — G4733 Obstructive sleep apnea (adult) (pediatric): Secondary | ICD-10-CM

## 2011-06-15 MED ORDER — PHENYLEPHRINE HCL 1 % NA SOLN
3.0000 [drp] | Freq: Once | NASAL | Status: AC
  Administered 2011-06-15: 3 [drp] via NASAL

## 2011-06-15 NOTE — Assessment & Plan Note (Signed)
Flonase sufficient for control now

## 2011-06-15 NOTE — Assessment & Plan Note (Addendum)
Seems to have eustachian discomfort  today. Will give neb neo and suggest trial of sudafed.

## 2011-06-15 NOTE — Progress Notes (Signed)
Subjective:    Patient ID: Toni Palmer, female    DOB: 08-Jul-1956, 55 y.o.   MRN: [id number]  HPI    Review of Systems     Objective:   Physical Exam        Assessment & Plan:   Subjective:    Patient ID: Toni Palmer, female    DOB: 23-Mar-1956, 55 y.o.   MRN: [id number]  HPI 13 WF with known hx of Asthma, AR, and OSA  05/11/11 Acute OV - NP visit Pt presents for an acute work in visit. Complains of prod cough with yellow/green mucus, increased SOB, wheezing, sinus pressue/congestion, chills/sweats x1.5weeks . Has teeth pain with bending forward. Lots of sinus congestion and drainage. She was previous on  on advair , flonase  Stopped several months ago. Would like to restart. Needs new rx. Congestion is getting worse.  Pt informs me she is currently undergoing preliminary workup for Lap Band surgery.   06/15/11- 78 yo F Followed for asthma, allergic rhinitis, asthma, OSA, complicated by DM, GERD, obesity Feeling better after NP acute visit last month. Now well except dull pain behind angle of jaw on right and mild hoarseness. She feels better having restarted Advair and flonase. Anticipating lap band surgery. Prior PFT She is using CPAP all night every night- no problem 12 cwp/ [facility]. Nasal pillows mask.  Uses rescue inhaler before walking or water aerobics.    Review of Systems Constitutional:   No  weight loss, night sweats,  Fevers, chills, fatigue, or  lassitude.  HEENT:   No headaches,  Difficulty chewing or swallowing,  Tooth/dental problems, or  Sore throat,                No sneezing, itching, ear ache or popping,  +nasal congestion, post nasal drip,   CV:  No chest pain,  Orthopnea, PND, swelling in lower extremities, anasarca, dizziness, palpitations, syncope.   GI  No heartburn, indigestion, abdominal pain, nausea, vomiting, diarrhea, change in bowel habits, loss of appetite, bloody stools.   Resp:   No coughing up of blood.   No chest  wall deformity   + DOE w/ exertion  Skin: no rash or lesions.  GU: no dysuria, change in color of urine, no urgency or frequency.  No flank pain, no hematuria   MS:  No joint pain or swelling.  No decreased range of motion.  No back pain.  Psych:  No change in mood or affect. No depression or anxiety.  No memory loss.         Objective:  General- Alert, Oriented, Affect-appropriate, Distress- none acute   obese Skin- rash-none, lesions- none, excoriation- none Lymphadenopathy- none Head- atraumatic            Eyes- Gross vision intact, PERRLA, conjunctivae clear secretions            Ears- Hearing, canals normal            Nose- Clear, No-Septal dev, mucus, polyps, erosion, perforation             Throat- Mallampati II posteriorly placed, mucosa clear , drainage- none, tonsils- atrophic Neck- flexible , trachea midline, no stridor , thyroid nl, carotid no bruit Chest - symmetrical excursion , unlabored           Heart/CV- RRR , no murmur , no gallop  , no rub, nl s1 s2                           -  JVD- none , edema- none, stasis changes- none, varices- none           Lung- clear to P&A, wheeze- none, cough- none , dullness-none, rub- none           Chest wall-  Abd- tender-no, distended-no, bowel sounds-present, HSM- no Br/ Gen/ Rectal- Not done, not indicated Extrem- cyanosis- none, clubbing, none, atrophy- none, strength- nl Neuro- grossly intact to observation      Assessment & Plan:

## 2011-06-15 NOTE — Assessment & Plan Note (Signed)
Good compliance and control on CPAP. Pressure is adequate. If she loses weight/ bariatric surgery, that will help and we can anticipate CPAP adjustment during that time.

## 2011-06-15 NOTE — Assessment & Plan Note (Signed)
Maintenance Advair gives good control. Appropriate use of rescue inhaler before exercise. DOE mainly related to obesity and deconditioning.

## 2011-06-15 NOTE — Patient Instructions (Signed)
Neb nasal neo  Try Sudafed for the full feeling from eustachian tube dysfunction in the right ear  Continue Advair and Flonase  Keep up the good work with your CPAP at 12

## 2011-06-23 ENCOUNTER — Telehealth: Payer: Self-pay | Admitting: Gastroenterology

## 2011-06-23 NOTE — Telephone Encounter (Signed)
Pt states her psychiatrist thinks she may have bells palsy and she would like a referral for a neurologist. Informed pt that the referral should come from her primary care physician.

## 2011-06-27 ENCOUNTER — Ambulatory Visit (INDEPENDENT_AMBULATORY_CARE_PROVIDER_SITE_OTHER): Payer: BC Managed Care – PPO | Admitting: Internal Medicine

## 2011-06-27 ENCOUNTER — Encounter: Payer: Self-pay | Admitting: Internal Medicine

## 2011-06-27 DIAGNOSIS — G4733 Obstructive sleep apnea (adult) (pediatric): Secondary | ICD-10-CM

## 2011-06-27 DIAGNOSIS — K3184 Gastroparesis: Secondary | ICD-10-CM

## 2011-06-27 DIAGNOSIS — R413 Other amnesia: Secondary | ICD-10-CM

## 2011-06-27 DIAGNOSIS — Q670 Congenital facial asymmetry: Secondary | ICD-10-CM

## 2011-06-27 DIAGNOSIS — R5381 Other malaise: Secondary | ICD-10-CM

## 2011-06-27 DIAGNOSIS — Q674 Other congenital deformities of skull, face and jaw: Secondary | ICD-10-CM

## 2011-06-27 DIAGNOSIS — R5383 Other fatigue: Secondary | ICD-10-CM

## 2011-06-27 DIAGNOSIS — IMO0001 Reserved for inherently not codable concepts without codable children: Secondary | ICD-10-CM
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
## 2011-06-27 NOTE — Patient Instructions (Addendum)
Eat a low-fat diet with lots of fruits and vegetables, up to 7-9 servings per day. Avoid obesity; your goal is waist measurement < 40 inches.Consume less than 40 grams of sugar per day from foods & drinks with High Fructose Corn Sugar as #1,2,3 or # 4 on label. Follow the low carb nutrition program in [employer] as closely as possible to prevent Diabetes progression & complications. White carbohydrates (potatoes, rice, bread, and pasta) have a high spike of sugar and a high load of sugar. For example a  baked potato has a cup of sugar and a  french fry  2 teaspoons of sugar. Yams, wild  rice, whole grained bread &  wheat pasta have been much lower spike and load of  sugar. Portions should be the size of a deck of cards or your palm.   Verify that serum B12, TSH, and RPR (syphilis serology) have been completed in reference to the memory loss.

## 2011-07-26 ENCOUNTER — Ambulatory Visit: Payer: BC Managed Care – PPO | Admitting: Gastroenterology

## 2011-07-26 ENCOUNTER — Telehealth: Payer: Self-pay | Admitting: Gastroenterology

## 2011-07-26 NOTE — Telephone Encounter (Signed)
no

## 2011-08-29 ENCOUNTER — Ambulatory Visit (INDEPENDENT_AMBULATORY_CARE_PROVIDER_SITE_OTHER): Payer: BC Managed Care – PPO | Admitting: Gastroenterology

## 2011-08-29 ENCOUNTER — Encounter: Payer: Self-pay | Admitting: Gastroenterology

## 2011-08-29 DIAGNOSIS — E663 Overweight: Secondary | ICD-10-CM

## 2011-08-29 DIAGNOSIS — K3184 Gastroparesis: Secondary | ICD-10-CM

## 2011-08-29 DIAGNOSIS — Z86010 Personal history of colonic polyps: Secondary | ICD-10-CM

## 2011-08-29 NOTE — Assessment & Plan Note (Signed)
Plan followup colonoscopy 2014 

## 2011-08-29 NOTE — Progress Notes (Signed)
History of Present Illness:  Ms. Toni Palmer has returned for followup of gastroparesis and to discuss bariatric surgery. On metoclopramide she is doing well. In the past she has tried stopping metoclopramide without success. She has also tried domperidone but recalls getting quite sick while on this medication. Overall she feels well. She plans on pursuing bariatric surgery.  Patient's other medical problems include sleep apnea diabetes, GERD, asthma      Review of Systems: Pertinent positive and negative review of systems were noted in the above HPI section. All other review of systems were otherwise negative.    Current Medications, Allergies, Past Medical History, Past Surgical History, Family History and Social History were reviewed in [employer] electronic medical record  Vital signs were reviewed in today's medical record. Physical Exam: General: Well developed , well nourished, no acute distress Head: Normocephalic and atraumatic Eyes:  sclerae anicteric, EOMI Ears: Normal auditory acuity Mouth: No deformity or lesions Lungs: Clear throughout to auscultation Heart: Regular rate and rhythm; no murmurs, rubs or bruits Abdomen: Soft, non tender and non distended. No masses, hepatosplenomegaly or hernias noted. Normal Bowel sounds Rectal:deferred Musculoskeletal: Symmetrical with no gross deformities  Pulses:  Normal pulses noted Extremities: No clubbing, cyanosis, edema or deformities noted Neurological: Alert oriented x 4, grossly nonfocal Psychological:  Alert and cooperative. Normal mood and affect

## 2011-08-29 NOTE — Assessment & Plan Note (Signed)
The patient is doing well on metoclopramide and has not had any side effects. Attempts at weaning her from this or substituting domperidone have been unsuccessful. Plan to continue with the same.

## 2011-08-29 NOTE — Patient Instructions (Signed)
Call back as needed 

## 2011-08-29 NOTE — Assessment & Plan Note (Signed)
The patient is going to inquire about hepatic surgery which I fully support.

## 2011-12-19 ENCOUNTER — Ambulatory Visit: Payer: BC Managed Care – PPO | Admitting: Internal Medicine

## 2012-01-16 ENCOUNTER — Other Ambulatory Visit (INDEPENDENT_AMBULATORY_CARE_PROVIDER_SITE_OTHER): Payer: BC Managed Care – PPO

## 2012-01-16 DIAGNOSIS — R413 Other amnesia: Secondary | ICD-10-CM

## 2012-01-16 LAB — TSH: TSH: 3.51 u[IU]/mL (ref 0.35–5.50)

## 2012-01-16 LAB — VITAMIN B12: Vitamin B-12: 246 pg/mL (ref 211–911)

## 2012-01-17 LAB — RPR

## 2012-01-30 ENCOUNTER — Other Ambulatory Visit: Payer: Self-pay | Admitting: Gastroenterology

## 2012-02-28 ENCOUNTER — Encounter: Payer: Self-pay | Admitting: Internal Medicine

## 2012-02-28 ENCOUNTER — Ambulatory Visit (INDEPENDENT_AMBULATORY_CARE_PROVIDER_SITE_OTHER): Payer: BC Managed Care – PPO | Admitting: Internal Medicine

## 2012-02-28 ENCOUNTER — Telehealth: Payer: Self-pay | Admitting: Gastroenterology

## 2012-02-28 VITALS — Temp 98.5°F | Wt 248.0 lb

## 2012-02-28 DIAGNOSIS — J209 Acute bronchitis, unspecified: Secondary | ICD-10-CM

## 2012-02-28 MED ORDER — AZITHROMYCIN 250 MG PO TABS: ORAL_TABLET | ORAL | Status: AC

## 2012-02-28 MED ORDER — HYDROCODONE-HOMATROPINE 5-1.5 MG/5ML PO SYRP: 5.0000 mL | ORAL_SOLUTION | Freq: Four times a day (QID) | ORAL | Status: AC | PRN

## 2012-02-28 NOTE — Patient Instructions (Addendum)
Plain Mucinex for thick secretions ;force NON dairy fluids . Use a Neti pot daily as needed for sinus congestion. Nasal cleansing in the shower as discussed. Make sure that all residual soap is removed to prevent irritation.   Use albuterol every 4 hours as needed

## 2012-02-28 NOTE — Progress Notes (Signed)
  Subjective:    Patient ID: Toni Palmer, female    DOB: 03-Mar-1956, 56 y.o.   MRN: [id number]  HPI Respiratory tract infection Onset/symptoms:1 month ago as dry cough & chest tightness Exposures (illness/environmental/extrinsic):no Progression of symptoms:to severe paroxysmal cough Treatments/response:Mucinex DM/ some decrease in cough. She's only been using albuterol twice a day and only if she is going out or pre exercise Present symptoms: Fever/chills/sweats:no Frontal headache:no Facial pain:no Nasal purulence:no Sore throat:yes Dental pain:intermittently Lymphadenopathy:no Wheezing/shortness of breath:SOB w/o wheezing Cough/sputum/hemoptysis:green-yellow sputum Pleuritic pain:no Associated extrinsic/allergic symptoms:itchy eyes/ sneezing:no            Review of Systems she denies any associated reflux symptoms with a cough. She is not on ACE inhibitor     Objective:   Physical Exam General appearance:good health ;well nourished; no acute distress or increased work of breathing is present.  No  lymphadenopathy about the head, neck, or axilla noted.   Eyes: No conjunctival inflammation or lid edema is present.   Ears:  External ear exam shows no significant lesions or deformities.  Otoscopic examination reveals clear canals, tympanic membranes are intact bilaterally without bulging, retraction, inflammation or discharge.  Nose:  External nasal examination shows no deformity or inflammation. Nasal mucosa are dry without lesions or exudates. No septal dislocation or deviation.No obstruction to airflow. Hyponasal speech  Oral exam: Dental hygiene is good; lips and gums are healthy appearing.There is mild  oropharyngeal erythema . No  exudate noted.      Heart:  Pulse 96; regular rhythm. S1 and S2 normal without gallop, murmur, click. S 4 .   Lungs:Chest clear to auscultation; no wheezes, rhonchi,rales ,or rubs present.No increased work of breathing.     Extremities:  No cyanosis, edema, or clubbing  noted    Skin: Warm & dry          Assessment & Plan:   #1 acute bronchitis w/o bronchospasm Plan: See orders and recommendations

## 2012-02-29 MED ORDER — METOCLOPRAMIDE HCL 10 MG PO TABS: 10.0000 mg | ORAL_TABLET | Freq: Four times a day (QID) | ORAL | Status: DC

## 2012-02-29 NOTE — Telephone Encounter (Signed)
Medication sent to [facility]  Patient aware

## 2012-03-06 ENCOUNTER — Telehealth: Payer: Self-pay | Admitting: *Deleted

## 2012-03-06 MED ORDER — AZITHROMYCIN 250 MG PO TABS: ORAL_TABLET | ORAL | Status: DC

## 2012-03-06 NOTE — Telephone Encounter (Signed)
Does she feel better than when she was seen?  The runny nose is likely seasonal and the cough can persist days to weeks after the antibiotics are completed.  As long as she is feeling better, we treat the other things symptomatically w/ cough syrup and decongestants as needed

## 2012-03-06 NOTE — Telephone Encounter (Signed)
Pt states that she feels much better than when she was in the office but still continue to cough up greenish mucous. Per Dr Beverely Low ok to repeat Z-pak. Rx sent, Pt aware.

## 2012-03-06 NOTE — Telephone Encounter (Signed)
Pt still c/o coughing up greenish yellow mucous and stuffy nose. Pt note that she finish antibiotic on Monday..Please advise      Dr Alwyn Ren  Pt states that we were to contact Dr Maple Hudson and Dr Dalene Seltzer office to get records of Pt weight for the pass 5 year. Pt indicated that this was in reference to her lap band surgery.Please advise if this info has been obtain.

## 2012-04-19 ENCOUNTER — Telehealth: Payer: Self-pay | Admitting: Gastroenterology

## 2012-04-19 NOTE — Telephone Encounter (Signed)
Reviewed chart with pt and gave her the weight values.

## 2012-06-27 ENCOUNTER — Ambulatory Visit (INDEPENDENT_AMBULATORY_CARE_PROVIDER_SITE_OTHER): Payer: BC Managed Care – PPO | Admitting: General Surgery

## 2012-07-09 ENCOUNTER — Other Ambulatory Visit (INDEPENDENT_AMBULATORY_CARE_PROVIDER_SITE_OTHER): Payer: Self-pay | Admitting: General Surgery

## 2012-07-09 ENCOUNTER — Encounter (INDEPENDENT_AMBULATORY_CARE_PROVIDER_SITE_OTHER): Payer: Self-pay | Admitting: General Surgery

## 2012-07-09 ENCOUNTER — Ambulatory Visit (INDEPENDENT_AMBULATORY_CARE_PROVIDER_SITE_OTHER): Payer: BC Managed Care – PPO | Admitting: General Surgery

## 2012-07-09 VITALS — BP 150/84 | HR 74 | Temp 97.6°F | Ht 64.0 in | Wt 247.8 lb

## 2012-07-09 DIAGNOSIS — Z1231 Encounter for screening mammogram for malignant neoplasm of breast: Secondary | ICD-10-CM

## 2012-07-10 ENCOUNTER — Other Ambulatory Visit (INDEPENDENT_AMBULATORY_CARE_PROVIDER_SITE_OTHER): Payer: Self-pay | Admitting: General Surgery
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
## 2012-07-11 ENCOUNTER — Encounter (INDEPENDENT_AMBULATORY_CARE_PROVIDER_SITE_OTHER): Payer: Self-pay | Admitting: General Surgery

## 2012-07-11 ENCOUNTER — Other Ambulatory Visit (INDEPENDENT_AMBULATORY_CARE_PROVIDER_SITE_OTHER): Payer: Self-pay

## 2012-07-23 ENCOUNTER — Encounter (HOSPITAL_COMMUNITY)
Admission: RE | Admit: 2012-07-23 | Discharge: 2012-07-23 | Disposition: A | Discharge status: Home / Self Care | Payer: BC Managed Care – PPO | Source: Ambulatory Visit | Attending: General Surgery | Admitting: General Surgery

## 2012-07-23 MED ORDER — TECHNETIUM TC 99M SULFUR COLLOID
2.0000 | Freq: Once | INTRAVENOUS | Status: AC | PRN
  Administered 2012-07-23: 2 via INTRAVENOUS

## 2012-07-27 ENCOUNTER — Encounter: Payer: BC Managed Care – PPO | Attending: General Surgery | Admitting: *Deleted

## 2012-07-27 ENCOUNTER — Encounter: Payer: Self-pay | Admitting: *Deleted

## 2012-07-27 VITALS — Ht 64.0 in | Wt 248.0 lb

## 2012-07-27 DIAGNOSIS — Z713 Dietary counseling and surveillance: Secondary | ICD-10-CM | POA: Insufficient documentation

## 2012-07-27 DIAGNOSIS — Z01818 Encounter for other preprocedural examination: Secondary | ICD-10-CM | POA: Insufficient documentation

## 2012-07-27 NOTE — Patient Instructions (Signed)
   Follow Pre-Op Nutrition Goals to prepare for Gastric Bypass Surgery.   Call the [facility] at [phone] once you have been given your surgery date to enrolled in the Pre-Op Nutrition Class. You will need to attend this nutrition class 3-4 weeks prior to your surgery. 

## 2012-07-27 NOTE — Progress Notes (Addendum)
  Pre-Op Assessment Visit:  Pre-Operative RYGB Surgery  Medical Nutrition Therapy:  Appt start time: 0800   End time:  0905.  Patient was seen on 07/27/2012 for Pre-Operative RYGB Nutrition Assessment. Assessment and letter of approval faxed to [facility] Bariatric Surgery Program coordinator on 07/29/2012.  Approval letter sent to [facility] center and will be available in the chart under the media tab.  TANITA  BODY COMP RESULTS  07/27/12   %Fat 52.7%   Fat Mass (lbs) 130.5   Fat Free Mass (lbs) 117.5   Total Body Water (lbs) 86.0   Handouts given during visit include:  Pre-Op Goals   Bariatric Surgery Protein Shakes handout  Patient to call for Pre-Op and Post-Op Nutrition Education at the [facility] when surgery is scheduled.

## 2012-08-07 ENCOUNTER — Ambulatory Visit (HOSPITAL_COMMUNITY)
Admission: RE | Admit: 2012-08-07 | Discharge: 2012-08-07 | Disposition: A | Discharge status: Home / Self Care | Payer: BC Managed Care – PPO | Source: Ambulatory Visit | Attending: General Surgery | Admitting: General Surgery

## 2012-08-07 ENCOUNTER — Other Ambulatory Visit: Payer: Self-pay

## 2012-08-07 DIAGNOSIS — Z6841 Body Mass Index (BMI) 40.0 and over, adult: Secondary | ICD-10-CM | POA: Insufficient documentation

## 2012-08-07 DIAGNOSIS — Z1231 Encounter for screening mammogram for malignant neoplasm of breast: Secondary | ICD-10-CM | POA: Insufficient documentation

## 2012-08-07 DIAGNOSIS — E119 Type 2 diabetes mellitus without complications: Secondary | ICD-10-CM | POA: Insufficient documentation

## 2012-08-07 DIAGNOSIS — K449 Diaphragmatic hernia without obstruction or gangrene: Secondary | ICD-10-CM | POA: Insufficient documentation

## 2012-08-07 DIAGNOSIS — K7689 Other specified diseases of liver: Secondary | ICD-10-CM | POA: Insufficient documentation

## 2012-08-07 DIAGNOSIS — G4730 Sleep apnea, unspecified: Secondary | ICD-10-CM | POA: Insufficient documentation

## 2012-08-07 DIAGNOSIS — K219 Gastro-esophageal reflux disease without esophagitis: Secondary | ICD-10-CM | POA: Insufficient documentation

## 2012-08-07 DIAGNOSIS — K8020 Calculus of gallbladder without cholecystitis without obstruction: Secondary | ICD-10-CM | POA: Insufficient documentation

## 2012-08-07 DIAGNOSIS — I10 Essential (primary) hypertension: Secondary | ICD-10-CM | POA: Insufficient documentation

## 2012-08-07 DIAGNOSIS — D649 Anemia, unspecified: Secondary | ICD-10-CM | POA: Insufficient documentation

## 2012-08-07 DIAGNOSIS — K224 Dyskinesia of esophagus: Secondary | ICD-10-CM | POA: Insufficient documentation

## 2012-08-09 ENCOUNTER — Telehealth (INDEPENDENT_AMBULATORY_CARE_PROVIDER_SITE_OTHER): Payer: Self-pay | Admitting: General Surgery

## 2012-08-09 LAB — CBC WITH DIFFERENTIAL/PLATELET
Basophils Absolute: 0 10*3/uL (ref 0.0–0.1)
Basophils Relative: 0 % (ref 0–1)
Eosinophils Absolute: 0.1 10*3/uL (ref 0.0–0.7)
Eosinophils Relative: 2 % (ref 0–5)
HCT: 40.6 % (ref 36.0–46.0)
Hemoglobin: 13.5 g/dL (ref 12.0–15.0)
Lymphocytes Relative: 32 % (ref 12–46)
Lymphs Abs: 2.5 10*3/uL (ref 0.7–4.0)
MCH: 29.5 pg (ref 26.0–34.0)
MCHC: 33.3 g/dL (ref 30.0–36.0)
MCV: 88.8 fL (ref 78.0–100.0)
Monocytes Absolute: 0.5 10*3/uL (ref 0.1–1.0)
Monocytes Relative: 6 % (ref 3–12)
Neutro Abs: 4.6 10*3/uL (ref 1.7–7.7)
Neutrophils Relative %: 60 % (ref 43–77)
Platelets: 394 10*3/uL (ref 150–400)
RBC: 4.57 MIL/uL (ref 3.87–5.11)
RDW: 14.2 % (ref 11.5–15.5)
WBC: 7.8 10*3/uL (ref 4.0–10.5)

## 2012-08-09 NOTE — Telephone Encounter (Signed)
Message copied by Liliana Cline on Fri Aug 09, 2012 10:16 AM ------      Message from: Rise Paganini      Created: Thu Aug 08, 2012  3:31 PM      Regarding: Andrey Campanile      Contact: [phone]       Patient stated that she has completed everything on the bariatric list and has to complete the blood work. She will need orders and asked can you send directly to the lab. Please call and discuss. Thank you.

## 2012-08-09 NOTE — Telephone Encounter (Signed)
[facility] making patient aware I faxed the requisition to [facility] as requested. Will await lab results.

## 2012-08-10 LAB — COMPREHENSIVE METABOLIC PANEL
ALT: 21 U/L (ref 0–35)
AST: 19 U/L (ref 0–37)
Albumin: 4.5 g/dL (ref 3.5–5.2)
Alkaline Phosphatase: 97 U/L (ref 39–117)
BUN: 14 mg/dL (ref 6–23)
CO2: 27 mEq/L (ref 19–32)
Calcium: 9.3 mg/dL (ref 8.4–10.5)
Chloride: 102 mEq/L (ref 96–112)
Creat: 1.04 mg/dL (ref 0.50–1.10)
Glucose, Bld: 143 mg/dL — ABNORMAL HIGH (ref 70–99)
Potassium: 4.4 mEq/L (ref 3.5–5.3)
Sodium: 140 mEq/L (ref 135–145)
Total Bilirubin: 0.7 mg/dL (ref 0.3–1.2)
Total Protein: 6.7 g/dL (ref 6.0–8.3)

## 2012-08-12 LAB — H. PYLORI ANTIBODY, IGG: H Pylori IgG: <0.4 {ISR}

## 2012-09-04 ENCOUNTER — Telehealth: Payer: Self-pay | Admitting: Gastroenterology

## 2012-09-04 NOTE — Telephone Encounter (Signed)
Left message for pt to call back  °

## 2012-09-05 NOTE — Telephone Encounter (Signed)
Left message for pt to call back.  Pt states she has a "little twitch" in her eye and her foot. Her PCP states it could be the reglan. Instructed pt to stop the medication but the pt does not want to. Pt scheduled to see Mike Gip PA tomorrow at 1:30pm. Pt aware of appt date and time.

## 2012-09-06 ENCOUNTER — Encounter: Payer: Self-pay | Admitting: *Deleted

## 2012-09-06 ENCOUNTER — Ambulatory Visit (INDEPENDENT_AMBULATORY_CARE_PROVIDER_SITE_OTHER): Payer: BC Managed Care – PPO | Admitting: Physician Assistant

## 2012-09-06 VITALS — BP 104/68 | HR 68 | Ht 63.0 in | Wt 240.6 lb

## 2012-09-06 DIAGNOSIS — K3184 Gastroparesis: Secondary | ICD-10-CM

## 2012-09-06 DIAGNOSIS — T887XXA Unspecified adverse effect of drug or medicament, initial encounter: Secondary | ICD-10-CM

## 2012-09-06 MED ORDER — AMBULATORY NON FORMULARY MEDICATION: 250.0000 mg | Freq: Three times a day (TID) | Status: DC

## 2012-09-06 MED ORDER — DEXLANSOPRAZOLE 60 MG PO CPDR: 60.0000 mg | DELAYED_RELEASE_CAPSULE | Freq: Every day | ORAL | Status: DC

## 2012-09-06 NOTE — Patient Instructions (Addendum)
Please call back with a follow up appointment as needed   We have given you samples of the following medication to take, Dexilant. Please take one capsule by mouth thirty minutes before breakfast  We have sent the following medications to [facility] for you to pick up at your convenience Erythromycin please take as directed   Please stop Reglan    Please follow Gastroparesis diet given today

## 2012-09-06 NOTE — Progress Notes (Signed)
Subjective:    Patient ID: Toni Palmer, female    DOB: September 10, 1956, 56 y.o.   MRN: [id number]  HPI Toni Palmer is a 56 year old female known to Dr. Arlyce Dice who has a diagnosis of gastroparesis. She is diabetic, also has history of asthma hypothyroidism hypertension and morbid obesity. She was diagnosed with gastroparesis and 2009 with an abnormal gastric emptying scan showing 47% retention at 2 hours. She's been on Reglan 10 mg 4 times daily since and says that she has felt like it has definitely helped her symptoms. Apparently she was switched to a trial of domperidone at one point in the past few years but did not find this to be effective. She has undergone recent pre-bariatric evaluation with Dr. Andrey Campanile and is considering a Roux-en-Y gastric bypass. As part of that evaluation she has also undergone upper [place] ultrasound which does show multiple small gallstones and a gastric emptying scan 08/02/2012 that showed no emptying of her stomach at 2 hours She also had an upper GI done which showed a small hiatal hernia but was otherwise normal and did show emptying into the duodenum. She comes in today because she is concerned about possible side effects from Reglan and was told by a no other physician that she should have this checked. She says she has had fairly chronic heartburn is not taking any regular acid blocker. She has no complaints of dysphagia or odynophagia appear he has not been having any nausea or vomiting and has no complaints of abdominal pain . Was noted by a family member that she had a lot of movement in her legs and says that she's not sure how long she's been doing this but says when she sitting still she's usually moving her feet. She doesn't have any symptoms of restless leg syndrome at night and this movement does not make her uncomfortable in all appear she also says that she has noticed that her right eyelid droops at times but again has not noticed any muscular fasciculations,  twitching etc.    Review of Systems  Constitutional: Negative.   HENT: Negative.   Eyes: Negative.   Cardiovascular: Negative.   Gastrointestinal: Negative.   Genitourinary: Negative.   Musculoskeletal: Negative.   Neurological: Negative.   Hematological: Negative.   Psychiatric/Behavioral: Negative.    Outpatient Encounter Prescriptions as of 09/06/2012  Medication Sig Dispense Refill  . albuterol (VENTOLIN HFA) 108 (90 BASE) MCG/ACT inhaler Inhale 2 puffs into the lungs 4 (four) times daily as needed.  1 Inhaler  5  . amphetamine-dextroamphetamine (ADDERALL XR) 30 MG 24 hr capsule Take 30 mg by mouth 2 (two) times daily.        . Ascorbic Acid (VITAMIN C) 100 MG tablet Take 500 mg by mouth daily.        Marland Kitchen aspirin 81 MG tablet Take 81 mg by mouth daily.        Marland Kitchen azithromycin (ZITHROMAX) 250 MG tablet As directed  6 tablet  0  . Calcium-Vitamin D 500-125 MG-UNIT TABS Take 1 tablet by mouth daily.        . clorazepate (TRANXENE) 15 MG tablet Take 15 mg by mouth daily as needed.        . DULoxetine (CYMBALTA) 30 MG capsule Take 30 mg by mouth daily. 2 by mouth once daily      . fluticasone (FLONASE) 50 MCG/ACT nasal spray Place 2 sprays into the nose daily.  16 g  5  . Fluticasone-Salmeterol (ADVAIR DISKUS) 250-50 MCG/DOSE  AEPB Inhale 1 puff into the lungs every 12 (twelve) hours.  60 each  5  . furosemide (LASIX) 40 MG tablet Take 40 mg by mouth daily.        . hydrocortisone butyrate (LUCOID) 0.1 % CREA cream Apply 2 application topically 2 (two) times daily.       Marland Kitchen lamoTRIgine (LAMICTAL) 25 MG tablet Take 25 mg by mouth daily.       Marland Kitchen LEVOXYL 75 MCG tablet Take 1 tablet by mouth daily.      Marland Kitchen loratadine (CLARITIN) 10 MG tablet Take 10 mg by mouth daily.        Marland Kitchen losartan (COZAAR) 50 MG tablet       . metFORMIN (GLUCOPHAGE) 1000 MG tablet Take 1,000 mg by mouth 2 (two) times daily with a meal.      . Multiple Vitamin (MULTIVITAMIN PO) Take 1 tablet by mouth daily.        .  naproxen sodium (ANAPROX) 220 MG tablet Take 220 mg by mouth. 3 cap by mouth two times daily      . simvastatin (ZOCOR) 20 MG tablet       . traZODone (DESYREL) 100 MG tablet Take 1 tablet by mouth At bedtime.      Marland Kitchen DISCONTD: metoCLOPramide (REGLAN) 10 MG tablet Take 1 tablet (10 mg total) by mouth 4 (four) times daily.  120 tablet  3  . AMBULATORY NON FORMULARY MEDICATION Take 250 mg by mouth 3 (three) times daily before meals. Medication Name: Erythromycin 250 mg  Please take three times a day before meals  100 mg  3  . dexlansoprazole (DEXILANT) 60 MG capsule Take 1 capsule (60 mg total) by mouth daily.  15 capsule  0  . DISCONTD: metoCLOPramide (REGLAN) 10 MG tablet TAKE ONE TABLET BY MOUTH FOUR TIMES DAILY. TAKE BEFORE MEALS AND AT BEDTIME  120 tablet  0   No Known Allergies     Patient Active Problem List  Diagnosis  . HYPOTHYROIDISM  . DM w/o Complication Type II  . DIABETES MELLITUS, TYPE II, UNCONTROLLED  . OVERWEIGHT  . Unspecified Anemia  . OBSTRUCTIVE SLEEP APNEA  . HYPERTENSION, ESSENTIAL NOS  . ALLERGIC RHINITIS  . ASTHMA  . GERD  . GASTROPARESIS  . OSTEOPOROSIS  . FATIGUE, CHRONIC  . Personal history of colonic polyps  . REDUCTION MAMMOPLASTY, HX OF  . Eustachian tube dysfunction  . Obesity, Class III, BMI 40-49.9 (morbid obesity)   History   Social History  . Marital Status: Divorced    Spouse Name: N/A    Number of Children: 2  . Years of Education: N/A   Occupational History  . retired   .     Social History Main Topics  . Smoking status: Former Smoker -- 0.5 packs/day for 11 years    Types: Cigarettes    Quit date: 11/20/1992  . Smokeless tobacco: Never Used  . Alcohol Use: No  . Drug Use: No  . Sexually Active: Not on file   Other Topics Concern  . Not on file   Social History Narrative  . No narrative on file    Objective:   Physical Exam well-developed white female in no acute distress, pleasant blood pressure 104/68 pulse 68  height 5 foot 3 weight 240. HEENT; nontraumatic normocephalic EOMI PERRLA sclera anicteric, Neck;Supple no JVD, Cardiovascular ;regular rate and rhythm with S1-S2 no murmur or gallop, Pulmonary; clear bilaterally, Abdomen; obese soft nontender nondistended bowel sounds are  active no succussion splash no palpable mass or hepatosplenomegaly Rectal; not done, Extremities; no clubbing cyanosis or edema skin warm and dry, Neuro; no evidence of dystonia or twitching or other involuntary movement, Psych; mood and affect appropriate        Assessment & Plan:  #26 56 year old female with chronic diabetic gastroparesis, who has been maintained on chronic Reglan for several years. Patient now concerned about very nonspecific movements of her legs and and intermittent droop of her right eyelid. Her lower extremity movements do not seem to be consistent with any sort of dystonic reaction. However recent gastric emptying scan on Reglan 10 mg 4 times daily showed 0% emptying at 2 hours so one could argue that she may not be giving much benefit from the Reglan.  #2 morbid obesity-undergoing evaluation with Dr. Gaynelle Adu for Roux-en-Y gastric bypass which she hopes to have done sometime in the next couple of months  #3 GERD #4 cholelithiasis  Plan; discussed options with the patient, for now we'll stop Reglan and see if this has any effect on her nonspecific lower extremity movement Start trial of erythromycin 250 mg and suspension 3 times daily before meals Trial of Dexilant 60 mg by mouth every morning, samples and a prescription were given today Followup with Dr. Arlyce Dice as needed.

## 2012-09-09 NOTE — Progress Notes (Signed)
Reviewed and agree with management. Robert D. Kaplan, M.D., FACG  

## 2012-09-11 ENCOUNTER — Telehealth (INDEPENDENT_AMBULATORY_CARE_PROVIDER_SITE_OTHER): Payer: Self-pay | Admitting: General Surgery

## 2012-09-11 NOTE — Telephone Encounter (Signed)
Patient aware we will proceed with getting Roux N Y approved by insurance with her gastroparesis she is not a candidate for lap band. She is aware and we will call to set up once insurance approves.

## 2012-09-13 ENCOUNTER — Other Ambulatory Visit (INDEPENDENT_AMBULATORY_CARE_PROVIDER_SITE_OTHER): Payer: Self-pay | Admitting: General Surgery

## 2012-09-13 ENCOUNTER — Telehealth (INDEPENDENT_AMBULATORY_CARE_PROVIDER_SITE_OTHER): Payer: Self-pay | Admitting: General Surgery

## 2012-09-13 NOTE — Telephone Encounter (Signed)
Attempted to contact pt to discuss proposed Gastric bypass surgery and make sure all of her questions were asked and answered since she is not a candidate for LapBand due to gastroparesis. LM for pt to call me

## 2012-09-30 ENCOUNTER — Telehealth: Payer: Self-pay | Admitting: Physician Assistant

## 2012-09-30 NOTE — Telephone Encounter (Signed)
Pt states the Dexilant samples she was given for heartburn are not working. Pt also states the erythromycin she was given for gastroparesis did not help either. Pt wants to know what else she can take. She tried Reglan but had to stop taking it. Dr. Arlyce Dice please advise.

## 2012-09-30 NOTE — Telephone Encounter (Signed)
Begin domperidone 10 mg one half hour a.c. and at bedtime. Continue dexilant twice a day for now.

## 2012-09-30 NOTE — Telephone Encounter (Signed)
Left message for pt to call back  °

## 2012-10-01 MED ORDER — DEXLANSOPRAZOLE 60 MG PO CPDR: 60.0000 mg | DELAYED_RELEASE_CAPSULE | Freq: Two times a day (BID) | ORAL | Status: DC

## 2012-10-01 NOTE — Telephone Encounter (Signed)
Pt states she took domperidone in the past and that it does not work. She recently tried erythromycin and it didn't work. Pt states the reglan made her feel better. She states she stopped it because her eye would close on its own and she wouldn't know it. When she realized it was closed she could open it. Pt wants to know if it would be ok for her to try the reglan again. Please advise.

## 2012-10-02 MED ORDER — METOCLOPRAMIDE HCL 10 MG PO TABS
10.0000 mg | ORAL_TABLET | Freq: Four times a day (QID) | ORAL | Status: DC
Start: 2012-10-02 — End: 2012-10-11

## 2012-10-02 NOTE — Telephone Encounter (Signed)
Pt aware.

## 2012-10-02 NOTE — Telephone Encounter (Signed)
I am not certain what she describes (eyelid closing) is attributable to Reglan.  Okay to start Reglan.  Instruct patient to  contact me immediately  if she develops any side effects from her Reglan including paresthesias, tremors, confusion , weakness or muscle spasms.

## 2012-10-07 ENCOUNTER — Telehealth: Payer: Self-pay | Admitting: Gastroenterology

## 2012-10-07 ENCOUNTER — Ambulatory Visit: Payer: BC Managed Care – PPO | Admitting: Gastroenterology

## 2012-10-07 NOTE — Telephone Encounter (Signed)
Pt states she checked with her pharmacy and they did not get the 2 prescriptions that were supposed to be sent in for her. Called [facility] on [address] and called in the scripts for the pt. Dexilant and Reglan sent in for her. Pt is aware.

## 2012-10-10 ENCOUNTER — Encounter: Payer: BC Managed Care – PPO | Attending: General Surgery | Admitting: *Deleted

## 2012-10-10 ENCOUNTER — Encounter (INDEPENDENT_AMBULATORY_CARE_PROVIDER_SITE_OTHER): Payer: Self-pay | Admitting: General Surgery

## 2012-10-10 ENCOUNTER — Telehealth (INDEPENDENT_AMBULATORY_CARE_PROVIDER_SITE_OTHER): Payer: Self-pay

## 2012-10-10 ENCOUNTER — Ambulatory Visit (INDEPENDENT_AMBULATORY_CARE_PROVIDER_SITE_OTHER): Payer: BC Managed Care – PPO | Admitting: General Surgery

## 2012-10-10 VITALS — BP 136/82 | HR 90 | Temp 99.0°F | Resp 20 | Ht 64.0 in | Wt 241.2 lb

## 2012-10-10 VITALS — Ht 64.0 in | Wt 241.4 lb

## 2012-10-10 DIAGNOSIS — E66813 Obesity, class 3: Secondary | ICD-10-CM

## 2012-10-10 DIAGNOSIS — Z713 Dietary counseling and surveillance: Secondary | ICD-10-CM | POA: Insufficient documentation

## 2012-10-10 DIAGNOSIS — Z01818 Encounter for other preprocedural examination: Secondary | ICD-10-CM | POA: Insufficient documentation

## 2012-10-10 NOTE — Telephone Encounter (Signed)
I called patient to let her know she was asked routine questions if she ever had a stress test before. They were not suggesting she needs one. She is good to go for surgery.
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
## 2012-10-10 NOTE — Telephone Encounter (Signed)
Message copied by Brennan Bailey on Thu Oct 10, 2012  1:20 PM ------      Message from: Andrey Campanile, ERIC M      Created: Thu Oct 10, 2012 12:55 PM       pls call pt and let her know the stress test mentioned during the phone call was from a set of routine questions they ask all pts to see if they have had one in the past            Thanks      wilson

## 2012-10-10 NOTE — Patient Instructions (Addendum)
Keep up with your preop diet I will call you about your GI medications

## 2012-10-11 ENCOUNTER — Encounter (HOSPITAL_COMMUNITY): Payer: Self-pay | Admitting: Pharmacy Technician

## 2012-10-12 NOTE — Progress Notes (Signed)
Bariatric Class:  Appt start time: 0830 end time:  0930.  Pre-Operative Nutrition Class  Patient was seen on 10/10/12 for Pre-Operative Bariatric Surgery Education at [facility].   Surgery date: 10/22/12 Surgery type: RYGB Start weight at [facility]: 248.0 lbs (07/27/12) Weight today: 241.4 lbs BMI: 41.4 kg/m^2  Samples given per MNT protocol: Bariatric Advantage Multivitamin - 4 ea Lot # [id number] Exp: 12/13 (alerted pt to upcoming expiration date)  Bariatric Advantage Calcium + D - 1 ea Lot # [id number] Exp: 12/13 (alerted pt to upcoming expiration date)   Celebrate Vitamins Multivitamin - 1 ea Lot # [id number] Exp:07/15  Unjury Protein Powder - 1 pkt Lot # [id number] Exp:03/15  Premier Protein: 1 bottle Lot # [id number] Exp: 06/08/12  The following the learning objective met by the patient during this course:  Identifies Pre-Op Dietary Goals and will begin 2 weeks pre-operatively  Identifies appropriate sources of fluids and proteins   States protein recommendations and appropriate sources pre and post-operatively  Identifies Post-Operative Dietary Goals and will follow for 2 weeks post-operatively  Identifies appropriate multivitamin and calcium sources  Describes the need for physical activity post-operatively and will follow MD recommendations  States when to call healthcare provider regarding medication questions or post-operative complications  Handouts given during class include:  Pre-Op Bariatric Surgery Diet Handout  Protein Shake Handout  Post-Op Bariatric Surgery Nutrition Handout  BELT Program Information Flyer  Support Group Information Flyer  [facility] Bariatric Supplements Price List  Follow-Up Plan: Patient will follow-up at [facility] 2 weeks post operatively for diet advancement per MD.

## 2012-10-13 NOTE — Patient Instructions (Signed)
Follow:   Pre-Op Diet per MD 2 weeks prior to surgery  Phase 2- Liquids (clear/full) 2 weeks after surgery  Vitamin/Mineral/Calcium guidelines for purchasing bariatric supplements  Exercise guidelines pre and post-op per MD  Follow-up at [facility] in 2 weeks post-op for diet advancement. Contact Sara Himmelrich as needed with questions/concerns. 

## 2012-10-14 ENCOUNTER — Telehealth (INDEPENDENT_AMBULATORY_CARE_PROVIDER_SITE_OTHER): Payer: Self-pay | Admitting: General Surgery

## 2012-10-14 ENCOUNTER — Encounter (HOSPITAL_COMMUNITY)
Admission: RE | Admit: 2012-10-14 | Discharge: 2012-10-14 | Disposition: A | Discharge status: Home / Self Care | Payer: BC Managed Care – PPO | Source: Ambulatory Visit | Attending: General Surgery | Admitting: General Surgery

## 2012-10-14 ENCOUNTER — Encounter (HOSPITAL_COMMUNITY): Payer: Self-pay

## 2012-10-14 DIAGNOSIS — K8020 Calculus of gallbladder without cholecystitis without obstruction: Secondary | ICD-10-CM

## 2012-10-14 HISTORY — DX: Calculus of gallbladder without cholecystitis without obstruction: K80.20

## 2012-10-14 LAB — CBC WITH DIFFERENTIAL/PLATELET
Basophils Absolute: 0 10*3/uL (ref 0.0–0.1)
Basophils Relative: 0 % (ref 0–1)
Eosinophils Absolute: 0.1 10*3/uL (ref 0.0–0.7)
Eosinophils Relative: 1 % (ref 0–5)
HCT: 41.4 % (ref 36.0–46.0)
Hemoglobin: 13.6 g/dL (ref 12.0–15.0)
Lymphocytes Relative: 19 % (ref 12–46)
Lymphs Abs: 2.4 10*3/uL (ref 0.7–4.0)
MCH: 29.9 pg (ref 26.0–34.0)
MCHC: 32.9 g/dL (ref 30.0–36.0)
MCV: 91 fL (ref 78.0–100.0)
Monocytes Absolute: 0.8 10*3/uL (ref 0.1–1.0)
Monocytes Relative: 6 % (ref 3–12)
Neutro Abs: 9.1 10*3/uL — ABNORMAL HIGH (ref 1.7–7.7)
Neutrophils Relative %: 74 % (ref 43–77)
Platelets: 358 10*3/uL (ref 150–400)
RBC: 4.55 MIL/uL (ref 3.87–5.11)
RDW: 13.2 % (ref 11.5–15.5)
WBC: 12.4 10*3/uL — ABNORMAL HIGH (ref 4.0–10.5)

## 2012-10-14 LAB — SURGICAL PCR SCREEN
MRSA, PCR: NEGATIVE
Staphylococcus aureus: POSITIVE — AB

## 2012-10-14 LAB — COMPREHENSIVE METABOLIC PANEL
ALT: 21 U/L (ref 0–35)
AST: 21 U/L (ref 0–37)
Albumin: 4.1 g/dL (ref 3.5–5.2)
Alkaline Phosphatase: 118 U/L — ABNORMAL HIGH (ref 39–117)
BUN: 22 mg/dL (ref 6–23)
CO2: 28 mEq/L (ref 19–32)
Calcium: 10.1 mg/dL (ref 8.4–10.5)
Chloride: 100 mEq/L (ref 96–112)
Creatinine, Ser: 1.11 mg/dL — ABNORMAL HIGH (ref 0.50–1.10)
GFR calc Af Amer: 63 mL/min — ABNORMAL LOW (ref >90)
GFR calc non Af Amer: 54 mL/min — ABNORMAL LOW (ref >90)
Glucose, Bld: 114 mg/dL — ABNORMAL HIGH (ref 70–99)
Potassium: 4.2 mEq/L (ref 3.5–5.1)
Sodium: 139 mEq/L (ref 135–145)
Total Bilirubin: 0.4 mg/dL (ref 0.3–1.2)
Total Protein: 7.2 g/dL (ref 6.0–8.3)

## 2012-10-14 NOTE — Patient Instructions (Addendum)
[address] DANELLA PHILSON  10/14/2012   Your procedure is scheduled on:12-3   -2013  Report to [facility] at     0515   AM   Call this number if you have problems the morning of surgery: [phone]  Or Presurgical Testing [phone]([name])   Remember:   Do not eat food:After Midnight.    Take these medicines the morning of surgery with A SIP OF WATER: Albuterol inhaler, Advair inhaler and bring. Bring flonase nasal spray. Tranxene. Cymbalta. Levoxyl. Claritin.  Reglan. Zocor. Donot take any Diabetic meds AM of.             Bring Cpap mask and tubing Am of surgery.  Do not wear jewelry, make-up or nail polish.  Do not wear lotions, powders, or perfumes. You may wear deodorant.  Do not shave 48 hours prior to surgery.(face and neck okay, no shaving of legs)  Do not bring valuables to the hospital.  Contacts, dentures or bridgework may not be worn into surgery.  Leave suitcase in the car. After surgery it may be brought to your room.  For patients admitted to the hospital, checkout time is 11:00 AM the day of discharge.   Patients discharged the day of surgery will not be allowed to drive home. Must have responsible person with you x 24 hours once discharged.  Name and phone number of your driver:Kaitlin Odroneic [phone]   Special Instructions: CHG Shower Use Special Wash: see special instruction sheet.(avoid face and genitals)   Please read over the following fact sheets that you were given: MRSA Information,Incentive Spirometry Instruction.

## 2012-10-14 NOTE — Progress Notes (Signed)
10-14-12 Note viewable lab results in Epic-note C/d, CMP results done 10-14-12.W. Hendrick,RN

## 2012-10-14 NOTE — Pre-Procedure Instructions (Addendum)
10-14-12 EKG/ CXR -Epic 9'13. Pt. Reminded of any bowel prep instructions per MD office. 10-14-12 Dr. Andrey Campanile sent note to in basket -labs viewable.W. Kennon Portela 10-15-12 1430 #2 attempt to notify of Positive PCR screen - Staph aureus- voice message left for pt. To return call to make [place] aware message received. W. Kennon Portela

## 2012-10-14 NOTE — Telephone Encounter (Signed)
Patient called asking for an RX for a food scale to help after her gastric bypass. It is called a [employer] scale and helps measure foods and how much protein, fiber, carbs are in foods. Please advise if you can write RX for this.

## 2012-10-15 ENCOUNTER — Telehealth (INDEPENDENT_AMBULATORY_CARE_PROVIDER_SITE_OTHER): Payer: Self-pay | Admitting: General Surgery

## 2012-10-15 NOTE — Telephone Encounter (Signed)
Message copied by Liliana Cline on Tue Oct 15, 2012  3:20 PM ------      Message from: Andrey Campanile, ERIC M      Created: Tue Oct 15, 2012  1:54 PM       Needs to increase water intake - be drinking 6-8 glasses per day Labs ok for surgery

## 2012-10-15 NOTE — Telephone Encounter (Signed)
Ok to write Rx for me

## 2012-10-15 NOTE — Telephone Encounter (Signed)
Left message on machine for patient to call back and ask for me. Calling to inform patient to drink 6-8 glasses of water a day prior to surgery and to make her aware Dr Gardiner Barefoot RX for scale. He will write next week.

## 2012-10-16 NOTE — Telephone Encounter (Signed)
Patient made aware to drink lots of fluid prior to surgery.

## 2012-10-16 NOTE — Telephone Encounter (Signed)
Left another message on machine for patient to call back and ask for me.  

## 2012-10-21 MED ORDER — DEXTROSE 5 % IV SOLN
2.0000 g | INTRAVENOUS | Status: AC
  Administered 2012-10-22: 2 g via INTRAVENOUS
  Filled 2012-10-21: qty 2

## 2012-10-22 ENCOUNTER — Inpatient Hospital Stay (HOSPITAL_COMMUNITY): Payer: BC Managed Care – PPO | Admitting: Anesthesiology

## 2012-10-22 ENCOUNTER — Encounter (HOSPITAL_COMMUNITY): Payer: Self-pay | Admitting: Anesthesiology

## 2012-10-22 ENCOUNTER — Inpatient Hospital Stay (HOSPITAL_COMMUNITY)
Admission: RE | Admit: 2012-10-22 | Discharge: 2012-10-24 | DRG: 288 | Disposition: A | Discharge status: Home / Self Care | Payer: BC Managed Care – PPO | Source: Ambulatory Visit | Attending: General Surgery | Admitting: General Surgery

## 2012-10-22 ENCOUNTER — Encounter (HOSPITAL_COMMUNITY): Payer: Self-pay | Admitting: *Deleted

## 2012-10-22 ENCOUNTER — Encounter (HOSPITAL_COMMUNITY)
Admission: RE | Disposition: A | Discharge status: Home / Self Care | Payer: Self-pay | Source: Ambulatory Visit | Attending: General Surgery

## 2012-10-22 DIAGNOSIS — J45909 Unspecified asthma, uncomplicated: Secondary | ICD-10-CM | POA: Diagnosis present

## 2012-10-22 DIAGNOSIS — J309 Allergic rhinitis, unspecified: Secondary | ICD-10-CM | POA: Diagnosis present

## 2012-10-22 DIAGNOSIS — E119 Type 2 diabetes mellitus without complications: Secondary | ICD-10-CM | POA: Diagnosis present

## 2012-10-22 DIAGNOSIS — Z7982 Long term (current) use of aspirin: Secondary | ICD-10-CM

## 2012-10-22 DIAGNOSIS — K639 Disease of intestine, unspecified: Secondary | ICD-10-CM | POA: Diagnosis present

## 2012-10-22 DIAGNOSIS — Q453 Other congenital malformations of pancreas and pancreatic duct: Secondary | ICD-10-CM

## 2012-10-22 DIAGNOSIS — Z87891 Personal history of nicotine dependence: Secondary | ICD-10-CM

## 2012-10-22 DIAGNOSIS — M810 Age-related osteoporosis without current pathological fracture: Secondary | ICD-10-CM | POA: Diagnosis present

## 2012-10-22 DIAGNOSIS — M2550 Pain in unspecified joint: Secondary | ICD-10-CM | POA: Diagnosis present

## 2012-10-22 DIAGNOSIS — E785 Hyperlipidemia, unspecified: Secondary | ICD-10-CM | POA: Diagnosis present

## 2012-10-22 DIAGNOSIS — G4733 Obstructive sleep apnea (adult) (pediatric): Secondary | ICD-10-CM | POA: Diagnosis present

## 2012-10-22 DIAGNOSIS — K219 Gastro-esophageal reflux disease without esophagitis: Secondary | ICD-10-CM | POA: Diagnosis present

## 2012-10-22 DIAGNOSIS — Z6841 Body Mass Index (BMI) 40.0 and over, adult: Secondary | ICD-10-CM

## 2012-10-22 DIAGNOSIS — I10 Essential (primary) hypertension: Secondary | ICD-10-CM

## 2012-10-22 DIAGNOSIS — E039 Hypothyroidism, unspecified: Secondary | ICD-10-CM | POA: Diagnosis present

## 2012-10-22 DIAGNOSIS — K224 Dyskinesia of esophagus: Secondary | ICD-10-CM | POA: Diagnosis present

## 2012-10-22 DIAGNOSIS — K449 Diaphragmatic hernia without obstruction or gangrene: Secondary | ICD-10-CM | POA: Diagnosis present

## 2012-10-22 DIAGNOSIS — D649 Anemia, unspecified: Secondary | ICD-10-CM | POA: Diagnosis present

## 2012-10-22 DIAGNOSIS — E559 Vitamin D deficiency, unspecified: Secondary | ICD-10-CM | POA: Diagnosis present

## 2012-10-22 DIAGNOSIS — K3184 Gastroparesis: Secondary | ICD-10-CM | POA: Diagnosis present

## 2012-10-22 HISTORY — PX: MASS EXCISION: SHX2000

## 2012-10-22 HISTORY — PX: GASTRIC ROUX-EN-Y: SHX5262

## 2012-10-22 LAB — HEMOGLOBIN AND HEMATOCRIT, BLOOD
HCT: 36.5 % (ref 36.0–46.0)
Hemoglobin: 12.4 g/dL (ref 12.0–15.0)

## 2012-10-22 LAB — GLUCOSE, CAPILLARY
Glucose-Capillary: 125 mg/dL — ABNORMAL HIGH (ref 70–99)
Glucose-Capillary: 190 mg/dL — ABNORMAL HIGH (ref 70–99)
Glucose-Capillary: 138 mg/dL — ABNORMAL HIGH (ref 70–99)
Glucose-Capillary: 105 mg/dL — ABNORMAL HIGH (ref 70–99)

## 2012-10-22 LAB — CREATININE, SERUM
Creatinine, Ser: 1.03 mg/dL (ref 0.50–1.10)
GFR calc Af Amer: 69 mL/min — ABNORMAL LOW (ref >90)
GFR calc non Af Amer: 59 mL/min — ABNORMAL LOW (ref >90)

## 2012-10-22 SURGERY — LAPAROSCOPIC ROUX-EN-Y GASTRIC BYPASS WITH UPPER ENDOSCOPY
Anesthesia: General | Site: Abdomen | Wound class: Clean Contaminated

## 2012-10-22 MED ORDER — ACETAMINOPHEN 160 MG/5ML PO SOLN: 650.0000 mg | ORAL | Status: DC | PRN

## 2012-10-22 MED ORDER — LACTATED RINGERS IV SOLN
INTRAVENOUS | Status: DC | PRN
  Administered 2012-10-22 (×2): via INTRAVENOUS

## 2012-10-22 MED ORDER — BUPIVACAINE-EPINEPHRINE 0.25% -1:200000 IJ SOLN
INTRAMUSCULAR | Status: DC | PRN
  Administered 2012-10-22: 50 mL

## 2012-10-22 MED ORDER — NAPHAZOLINE HCL 0.1 % OP SOLN
1.0000 [drp] | Freq: Four times a day (QID) | OPHTHALMIC | Status: DC | PRN
  Filled 2012-10-22: qty 15

## 2012-10-22 MED ORDER — ACETAMINOPHEN 10 MG/ML IV SOLN
1000.0000 mg | Freq: Four times a day (QID) | INTRAVENOUS | Status: AC
  Administered 2012-10-22 – 2012-10-23 (×4): 1000 mg via INTRAVENOUS
  Filled 2012-10-22 (×7): qty 100

## 2012-10-22 MED ORDER — POTASSIUM CHLORIDE IN NACL 20-0.9 MEQ/L-% IV SOLN
INTRAVENOUS | Status: DC
  Administered 2012-10-22 – 2012-10-24 (×5): via INTRAVENOUS
  Filled 2012-10-22 (×7): qty 1000

## 2012-10-22 MED ORDER — OXYCODONE-ACETAMINOPHEN 5-325 MG/5ML PO SOLN
5.0000 mL | ORAL | Status: DC | PRN
  Administered 2012-10-23 – 2012-10-24 (×2): 5 mL via ORAL
  Filled 2012-10-22: qty 5
  Filled 2012-10-22: qty 10
  Filled 2012-10-22 (×2): qty 5

## 2012-10-22 MED ORDER — PROPOFOL 10 MG/ML IV BOLUS
INTRAVENOUS | Status: DC | PRN
  Administered 2012-10-22: 350 mg via INTRAVENOUS
  Administered 2012-10-22: 100 mg via INTRAVENOUS

## 2012-10-22 MED ORDER — MOMETASONE FURO-FORMOTEROL FUM 100-5 MCG/ACT IN AERO
2.0000 | INHALATION_SPRAY | Freq: Two times a day (BID) | RESPIRATORY_TRACT | Status: DC
  Administered 2012-10-22 – 2012-10-24 (×3): 2 via RESPIRATORY_TRACT
  Filled 2012-10-22: qty 8.8

## 2012-10-22 MED ORDER — ROCURONIUM BROMIDE 100 MG/10ML IV SOLN
INTRAVENOUS | Status: DC | PRN
  Administered 2012-10-22: 60 mg via INTRAVENOUS
  Administered 2012-10-22 (×2): 20 mg via INTRAVENOUS

## 2012-10-22 MED ORDER — LACTATED RINGERS IR SOLN
Status: DC | PRN
  Administered 2012-10-22: 3000 mL

## 2012-10-22 MED ORDER — MORPHINE SULFATE 2 MG/ML IJ SOLN
2.0000 mg | INTRAMUSCULAR | Status: DC | PRN
  Administered 2012-10-22 (×2): 4 mg via INTRAVENOUS
  Administered 2012-10-22: 2 mg via INTRAVENOUS
  Administered 2012-10-22 – 2012-10-23 (×2): 4 mg via INTRAVENOUS
  Administered 2012-10-23 (×2): 2 mg via INTRAVENOUS
  Filled 2012-10-22: qty 1
  Filled 2012-10-22: qty 2
  Filled 2012-10-22: qty 1
  Filled 2012-10-22: qty 2
  Filled 2012-10-22: qty 1
  Filled 2012-10-22 (×3): qty 2

## 2012-10-22 MED ORDER — PROMETHAZINE HCL 25 MG/ML IJ SOLN: 6.2500 mg | INTRAMUSCULAR | Status: DC | PRN

## 2012-10-22 MED ORDER — UNJURY VANILLA POWDER: 2.0000 [oz_av] | Freq: Four times a day (QID) | ORAL | Status: DC

## 2012-10-22 MED ORDER — UNJURY CHICKEN SOUP POWDER: 2.0000 [oz_av] | Freq: Four times a day (QID) | ORAL | Status: DC

## 2012-10-22 MED ORDER — ONDANSETRON HCL 4 MG/2ML IJ SOLN
4.0000 mg | INTRAMUSCULAR | Status: DC | PRN
  Administered 2012-10-23: 4 mg via INTRAVENOUS
  Filled 2012-10-22: qty 2

## 2012-10-22 MED ORDER — ACETAMINOPHEN 10 MG/ML IV SOLN
INTRAVENOUS | Status: DC | PRN
  Administered 2012-10-22: 1000 mg via INTRAVENOUS

## 2012-10-22 MED ORDER — FLUTICASONE PROPIONATE 50 MCG/ACT NA SUSP
2.0000 | Freq: Every day | NASAL | Status: DC
  Administered 2012-10-23 – 2012-10-24 (×2): 2 via NASAL
  Filled 2012-10-22: qty 16

## 2012-10-22 MED ORDER — ALBUTEROL SULFATE HFA 108 (90 BASE) MCG/ACT IN AERS
2.0000 | INHALATION_SPRAY | Freq: Four times a day (QID) | RESPIRATORY_TRACT | Status: DC | PRN
  Filled 2012-10-22: qty 6.7

## 2012-10-22 MED ORDER — HYDROMORPHONE HCL PF 1 MG/ML IJ SOLN: 0.2500 mg | INTRAMUSCULAR | Status: DC | PRN

## 2012-10-22 MED ORDER — STERILE WATER FOR IRRIGATION IR SOLN
Status: DC | PRN
  Administered 2012-10-22: 1000 mL

## 2012-10-22 MED ORDER — ENOXAPARIN SODIUM 40 MG/0.4ML ~~LOC~~ SOLN
40.0000 mg | SUBCUTANEOUS | Status: DC
  Administered 2012-10-23 – 2012-10-24 (×2): 40 mg via SUBCUTANEOUS
  Filled 2012-10-22 (×3): qty 0.4

## 2012-10-22 MED ORDER — SUFENTANIL CITRATE 50 MCG/ML IV SOLN
INTRAVENOUS | Status: DC | PRN
  Administered 2012-10-22 (×2): 10 ug via INTRAVENOUS
  Administered 2012-10-22: 5 ug via INTRAVENOUS
  Administered 2012-10-22: 10 ug via INTRAVENOUS
  Administered 2012-10-22 (×2): 5 ug via INTRAVENOUS
  Administered 2012-10-22: 15 ug via INTRAVENOUS
  Administered 2012-10-22: 20 ug via INTRAVENOUS
  Administered 2012-10-22 (×2): 5 ug via INTRAVENOUS

## 2012-10-22 MED ORDER — LIDOCAINE HCL (CARDIAC) 20 MG/ML IV SOLN
INTRAVENOUS | Status: DC | PRN
  Administered 2012-10-22: 100 mg via INTRAVENOUS

## 2012-10-22 MED ORDER — INSULIN ASPART 100 UNIT/ML ~~LOC~~ SOLN
0.0000 [IU] | SUBCUTANEOUS | Status: DC
  Administered 2012-10-22: 4 [IU] via SUBCUTANEOUS
  Administered 2012-10-22: 3 [IU] via SUBCUTANEOUS

## 2012-10-22 MED ORDER — SUCCINYLCHOLINE CHLORIDE 20 MG/ML IJ SOLN
INTRAMUSCULAR | Status: DC | PRN
  Administered 2012-10-22: 100 mg via INTRAVENOUS
  Administered 2012-10-22: 40 mg via INTRAVENOUS

## 2012-10-22 MED ORDER — EVICEL 5 ML EX KIT
PACK | CUTANEOUS | Status: DC | PRN
  Administered 2012-10-22 (×2): 5 mL

## 2012-10-22 MED ORDER — METOCLOPRAMIDE HCL 5 MG/ML IJ SOLN
INTRAMUSCULAR | Status: DC | PRN
  Administered 2012-10-22: 10 mg via INTRAVENOUS

## 2012-10-22 MED ORDER — HEPARIN SODIUM (PORCINE) 5000 UNIT/ML IJ SOLN
5000.0000 [IU] | INTRAMUSCULAR | Status: AC
  Administered 2012-10-22: 5000 [IU] via SUBCUTANEOUS
  Filled 2012-10-22: qty 1

## 2012-10-22 MED ORDER — MIDAZOLAM HCL 5 MG/5ML IJ SOLN
INTRAMUSCULAR | Status: DC | PRN
  Administered 2012-10-22: 2 mg via INTRAVENOUS

## 2012-10-22 MED ORDER — GLYCOPYRROLATE 0.2 MG/ML IJ SOLN
INTRAMUSCULAR | Status: DC | PRN
  Administered 2012-10-22: 0.6 mg via INTRAVENOUS

## 2012-10-22 MED ORDER — 0.9 % SODIUM CHLORIDE (POUR BTL) OPTIME
TOPICAL | Status: DC | PRN
  Administered 2012-10-22: 1000 mL

## 2012-10-22 MED ORDER — UNJURY CHOCOLATE CLASSIC POWDER
2.0000 [oz_av] | Freq: Four times a day (QID) | ORAL | Status: DC
  Administered 2012-10-24: 2 [oz_av] via ORAL

## 2012-10-22 MED ORDER — KETOROLAC TROMETHAMINE 30 MG/ML IJ SOLN: 15.0000 mg | Freq: Once | INTRAMUSCULAR | Status: DC | PRN

## 2012-10-22 MED ORDER — NEOSTIGMINE METHYLSULFATE 1 MG/ML IJ SOLN
INTRAMUSCULAR | Status: DC | PRN
  Administered 2012-10-22: 5 mg via INTRAVENOUS

## 2012-10-22 MED ORDER — PANTOPRAZOLE SODIUM 40 MG IV SOLR
40.0000 mg | INTRAVENOUS | Status: DC
  Administered 2012-10-22 – 2012-10-23 (×2): 40 mg via INTRAVENOUS
  Filled 2012-10-22 (×3): qty 40

## 2012-10-22 SURGICAL SUPPLY — 88 items
ADH SKN CLS APL DERMABOND .7 (GAUZE/BANDAGES/DRESSINGS) ×4
APL SKNCLS STERI-STRIP NONHPOA (GAUZE/BANDAGES/DRESSINGS)
APL SRG 32X5 SNPLK LF DISP (MISCELLANEOUS) ×2
APPLICATOR COTTON TIP 6IN STRL (MISCELLANEOUS) ×4 IMPLANT
APPLIER CLIP ROT 13.4 12 LRG (CLIP)
APR CLP LRG 13.4X12 ROT 20 MLT (CLIP)
BAG SPEC RTRVL LRG 6X4 10 (ENDOMECHANICALS)
BENZOIN TINCTURE PRP APPL 2/3 (GAUZE/BANDAGES/DRESSINGS) IMPLANT
BLADE SURG 15 STRL LF DISP TIS (BLADE) IMPLANT
BLADE SURG 15 STRL SS (BLADE)
BLADE SURG SZ11 CARB STEEL (BLADE) ×3 IMPLANT
CABLE HIGH FREQUENCY MONO STRZ (ELECTRODE) ×3 IMPLANT
CANISTER SUCTION 2500CC (MISCELLANEOUS) ×3 IMPLANT
CLIP APPLIE ROT 13.4 12 LRG (CLIP) IMPLANT
CLIP SUT LAPRA TY ABSORB (SUTURE) ×6 IMPLANT
CLOTH BEACON ORANGE TIMEOUT ST (SAFETY) ×3 IMPLANT
COVER SURGICAL LIGHT HANDLE (MISCELLANEOUS) ×3 IMPLANT
CUTTER LINEAR ENDO ART 45 ETS (STAPLE) IMPLANT
DERMABOND ADVANCED (GAUZE/BANDAGES/DRESSINGS) ×2
DERMABOND ADVANCED .7 DNX12 (GAUZE/BANDAGES/DRESSINGS) IMPLANT
DEVICE SUTURE ENDOST 10MM (ENDOMECHANICALS) ×3 IMPLANT
DISSECTOR BLUNT TIP ENDO 5MM (MISCELLANEOUS) IMPLANT
DRAIN PENROSE 18X1/4 LTX STRL (WOUND CARE) ×4 IMPLANT
DRAPE CAMERA CLOSED 9X96 (DRAPES) ×3 IMPLANT
DRAPE UTILITY XL STRL (DRAPES) ×3 IMPLANT
ELECT REM PT RETURN 9FT ADLT (ELECTROSURGICAL) ×3
ELECTRODE REM PT RTRN 9FT ADLT (ELECTROSURGICAL) ×2 IMPLANT
GAUZE SPONGE 4X4 16PLY XRAY LF (GAUZE/BANDAGES/DRESSINGS) ×3 IMPLANT
GLOVE BIO SURGEON STRL SZ7.5 (GLOVE) ×4 IMPLANT
GLOVE BIOGEL M STRL SZ7.5 (GLOVE) IMPLANT
GLOVE BIOGEL PI IND STRL 7.0 (GLOVE) ×2 IMPLANT
GLOVE BIOGEL PI INDICATOR 7.0 (GLOVE) ×1
GLOVE INDICATOR 8.0 STRL GRN (GLOVE) ×3 IMPLANT
GOWN STRL NON-REIN LRG LVL3 (GOWN DISPOSABLE) ×3 IMPLANT
GOWN STRL REIN XL XLG (GOWN DISPOSABLE) ×6 IMPLANT
HEMOSTAT SURGICEL 4X8 (HEMOSTASIS) IMPLANT
HOVERMATT SINGLE USE (MISCELLANEOUS) ×3 IMPLANT
KIT BASIN OR (CUSTOM PROCEDURE TRAY) ×3 IMPLANT
KIT GASTRIC LAVAGE 34FR ADT (SET/KITS/TRAYS/PACK) ×3 IMPLANT
MARKER SKIN DUAL TIP RULER LAB (MISCELLANEOUS) ×3 IMPLANT
NDL SPNL 22GX3.5 QUINCKE BK (NEEDLE) ×2 IMPLANT
NEEDLE SPNL 22GX3.5 QUINCKE BK (NEEDLE) ×3 IMPLANT
NS IRRIG 1000ML POUR BTL (IV SOLUTION) ×3 IMPLANT
PACK CARDIOVASCULAR III (CUSTOM PROCEDURE TRAY) ×3 IMPLANT
POUCH SPECIMEN RETRIEVAL 10MM (ENDOMECHANICALS) IMPLANT
RELOAD 45 VASCULAR/THIN (ENDOMECHANICALS) ×3 IMPLANT
RELOAD BLUE (STAPLE) ×3 IMPLANT
RELOAD ENDO STITCH 2.0 (ENDOMECHANICALS) ×45
RELOAD GOLD (STAPLE) ×1 IMPLANT
RELOAD STAPLE 45 2.5 WHT GRN (ENDOMECHANICALS) IMPLANT
RELOAD STAPLE 45 3.5 BLU ETS (ENDOMECHANICALS) IMPLANT
RELOAD STAPLE TA45 3.5 REG BLU (ENDOMECHANICALS) ×6 IMPLANT
RELOAD SUT SNGL STCH ABSRB 2-0 (ENDOMECHANICALS) IMPLANT
RELOAD SUT SNGL STCH BLK 2-0 (ENDOMECHANICALS) IMPLANT
RELOAD WHITE ECR60W (STAPLE) IMPLANT
SCALPEL HARMONIC ACE (MISCELLANEOUS) ×3 IMPLANT
SCISSORS LAP 5X35 DISP (ENDOMECHANICALS) ×3 IMPLANT
SEALANT SURGICAL APPL DUAL CAN (MISCELLANEOUS) ×3 IMPLANT
SET IRRIG TUBING LAPAROSCOPIC (IRRIGATION / IRRIGATOR) ×3 IMPLANT
SLEEVE ADV FIXATION 12X100MM (TROCAR) ×6 IMPLANT
SLEEVE ADV FIXATION 5X100MM (TROCAR) ×3 IMPLANT
SLEEVE ENDOPATH XCEL 5M (ENDOMECHANICALS) ×3 IMPLANT
SOLUTION ANTI FOG 6CC (MISCELLANEOUS) ×3 IMPLANT
SPONGE GAUZE 4X4 12PLY (GAUZE/BANDAGES/DRESSINGS) ×2 IMPLANT
STAPLE ECHEON FLEX 60 POW ENDO (STAPLE) ×3 IMPLANT
STAPLER VISISTAT 35W (STAPLE) ×3 IMPLANT
STRIP CLOSURE SKIN 1/2X4 (GAUZE/BANDAGES/DRESSINGS) IMPLANT
SUT ETHILON 3 0 PS 1 (SUTURE) IMPLANT
SUT MNCRL AB 4-0 PS2 18 (SUTURE) ×3 IMPLANT
SUT RELOAD ENDO STITCH 2 48X1 (ENDOMECHANICALS) ×20
SUT RELOAD ENDO STITCH 2.0 (ENDOMECHANICALS) ×10
SUT SILK 2 0 SH (SUTURE) IMPLANT
SUT VIC AB 2-0 SH 27 (SUTURE) ×3
SUT VIC AB 2-0 SH 27X BRD (SUTURE) ×2 IMPLANT
SUTURE RELOAD END STTCH 2 48X1 (ENDOMECHANICALS) ×20 IMPLANT
SUTURE RELOAD ENDO STITCH 2.0 (ENDOMECHANICALS) ×10 IMPLANT
SYR 20CC LL (SYRINGE) ×3 IMPLANT
SYR 50ML LL SCALE MARK (SYRINGE) ×3 IMPLANT
SYR CONTROL 10ML LL (SYRINGE) ×2 IMPLANT
TOWEL OR 17X26 10 PK STRL BLUE (TOWEL DISPOSABLE) ×3 IMPLANT
TRAY FOLEY CATH 14FRSI W/METER (CATHETERS) ×3 IMPLANT
TROCAR BALLN 12MMX100 BLUNT (TROCAR) ×3 IMPLANT
TROCAR BLADELESS OPT 5 100 (ENDOMECHANICALS) ×3 IMPLANT
TROCAR ENDOPATH XCEL 12X100 BL (ENDOMECHANICALS) ×9 IMPLANT
TROCAR XCEL 12X100 BLDLESS (ENDOMECHANICALS) ×3 IMPLANT
TUBING ENDO SMARTCAP (MISCELLANEOUS) ×3 IMPLANT
TUBING FILTER THERMOFLATOR (ELECTROSURGICAL) ×3 IMPLANT
WATER STERILE IRR 1500ML POUR (IV SOLUTION) ×3 IMPLANT

## 2012-10-22 NOTE — Anesthesia Preprocedure Evaluation (Signed)
Anesthesia Evaluation  Patient identified by MRN, date of birth, ID band Patient awake    Reviewed: Allergy & Precautions, H&P , NPO status , Patient's Chart, lab work & pertinent test results  Airway Mallampati: III TM Distance: <3 FB Neck ROM: Full    Dental No notable dental hx. (+) Dental Advisory Given   Pulmonary sleep apnea ,  breath sounds clear to auscultation  + decreased breath sounds      Cardiovascular hypertension, Pt. on medications negative cardio ROS  Rhythm:Regular Rate:Normal     Neuro/Psych negative neurological ROS  negative psych ROS   GI/Hepatic Neg liver ROS, GERD-  ,  Endo/Other  diabetes, Oral Hypoglycemic AgentsHypothyroidism Morbid obesity  Renal/GU negative Renal ROS  negative genitourinary   Musculoskeletal negative musculoskeletal ROS (+)   Abdominal   Peds negative pediatric ROS (+)  Hematology negative hematology ROS (+)   Anesthesia Other Findings   Reproductive/Obstetrics negative OB ROS                           Anesthesia Physical Anesthesia Plan  ASA: III  Anesthesia Plan: General   Post-op Pain Management:    Induction: Intravenous  Airway Management Planned: Oral ETT  Additional Equipment:   Intra-op Plan:   Post-operative Plan: Extubation in OR  Informed Consent: I have reviewed the patients History and Physical, chart, labs and discussed the procedure including the risks, benefits and alternatives for the proposed anesthesia with the patient or authorized representative who has indicated his/her understanding and acceptance.   Dental advisory given  Plan Discussed with: CRNA and Surgeon  Anesthesia Plan Comments:         Anesthesia Quick Evaluation

## 2012-10-22 NOTE — Anesthesia Postprocedure Evaluation (Signed)
  Anesthesia Post-op Note  Patient: Toni Palmer  Procedure(s) Performed: Procedure(s) (LRB): LAPAROSCOPIC ROUX-EN-Y GASTRIC BYPASS WITH UPPER ENDOSCOPY (N/A) EXCISION MASS ()  Patient Location: PACU  Anesthesia Type: General  Level of Consciousness: awake and alert   Airway and Oxygen Therapy: Patient Spontanous Breathing  Post-op Pain: mild  Post-op Assessment: Post-op Vital signs reviewed, Patient's Cardiovascular Status Stable, Respiratory Function Stable, Patent Airway and No signs of Nausea or vomiting  Last Vitals:  Filed Vitals:   10/22/12 1145  BP: 146/69  Pulse: 73  Temp:   Resp: 15    Post-op Vital Signs: stable   Complications: No apparent anesthesia complications
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
## 2012-10-22 NOTE — Progress Notes (Signed)
Patient has a red rash at bikini area States she has had this rash but became irritated past Hibiclens shower. Had patient use CHG cloth wipe this am avoiding this area.

## 2012-10-22 NOTE — Progress Notes (Signed)
Dr. Andrey Campanile return call "OK" informed patient had BM at midnight .

## 2012-10-22 NOTE — Op Note (Signed)
Preop diagnosis:  Morbid obesity BMI 41.4  DM II  OSA on CPAP  HTN  Asthma  Hypothyroidism  Gastroparesis  GERD  Joint Pain  Vit D deficiency  Postop diagnosis:  1. same  Surgical procedure: Laparoscopic Roux-en-Y gastric bypass (ante-colic, ante-gastric); upper endoscopy; Enucleation of jejunal mass  Surgeon: Atilano Ina, M.D. FACS  Asst.: Glenna Fellows, MD FACS  Anesthesia: General plus 0.25% marcaine with epi  Complications: None   EBL: Minimal   Drains: None   Specimen: jejunum mass  Disposition: PACU in good condition   Indications for procedure: 56yo WF with morbid obesity who has been unsuccessful at sustained weight loss. The patient's comorbidities are listed above. We discussed the risk and benefits of surgery including but not limited to anesthesia risk, bleeding, infection, blood clot formation, anastomotic leak, anastomotic stricture, ulcer formation, death, respiratory complications, intestinal blockage, internal hernia, gallstone formation, vitamin and nutritional deficiencies, injury to surrounding structures, failure to lose weight and mood changes.   Description of procedure: Patient is brought to the operating room and general anesthesia induced. The patient had received preoperative broad-spectrum IV antibiotics and subcutaneous heparin. The abdomen was widely sterilely prepped with Chloraprep and draped. Patient timeout was performed and correct patient and procedure confirmed. Access was obtained with a 12 mm Optiview trocar in the left upper quadrant and pneumoperitoneum established without difficulty. Under direct vision 12 mm trocars were placed laterally in the right upper quadrant, right upper quadrant midclavicular line, and to the left and above the umbilicus for the camera port. A 5 mm trocar was placed laterally in the left upper quadrant.  The omentum was brought into the upper abdomen and the transverse mesocolon elevated and the ligament of  Treitz clearly identified. Several centimeters distal to the ligament of Treitz there was a lesion in the proximal jejunal wall, about 1.5cm in size. After conferring with Dr Johna Sheriff we elected to remove this lesion. Using Harmonic scalpel, the lesion was removed in its entirety. It appeared to just involve the wall of the bowel and no go intramural. The mucosa appeared not to be violated. I closed the defect transversely with interrupted vicryl endostitches.  A 40 cm biliopancreatic limb was then carefully measured from the ligament of Treitz. The small intestine was divided at this point with a single firing of the white load linear stapler. A Penrose drain was sutured to the end of the Roux-en-Y limb for later identification. A 100 cm Roux-en-Y limb was then carefully measured. At this point a side-to-side anastomosis was created between the Roux limb and the end of the biliopancreatic limb. This was accomplished with a single firing of the 45 mm white load linear stapler. The common enterotomy was closed with a running 2-0 Vicryl begun at either end of the enterotomy and tied centrally. The mesenteric defect was then closed with running 2-0 silk. The anastomosis was coated with Tisseel. The omentum was then divided with the harmonic scalpel up towards the transverse colon to allow mobility of the Roux limb toward the gastric pouch. The patient was then placed in steep reversed Trendelenburg. Through a 5 mm subxiphoid site the [facility] retractor was placed and the left lobe of the liver elevated with excellent exposure of the upper stomach and hiatus. The angle of Hiss was then mobilized with the harmonic scalpel. A 4.5cm gastric pouch was then carefully measured along the lesser curve of the stomach. Dissection was carried along the lesser curve at this point with the Harmonic  scalpel working carefully back toward the lesser sac at right angles to the lesser curve. The free lesser sac was then entered. After  being sure all tubes were removed from the stomach an initial firing of the gold load 60 mm linear stapler was fired at right angles across the lesser curve for about 4 cm. The gastric pouch was further mobilized posteriorly and then the pouch was completed with 3 further firings of the 60 mm blue load linear stapler and 1 final firing of a blue load 45mm linear stapler up through the previously dissected angle of His. It was ensured that the pouch was completely mobilized away from the gastric remnant. This created a nice tubular 4-5 cm gastric pouch.  The Roux limb was then brought up in an antecolic fashion with the candycane facing to the patient's left without undue tension. The gastrojejunostomy was created with an initial posterior row of 2-0 Vicryl between the Roux limb and the staple line of the gastric pouch. Enterotomies were then made in the gastric pouch and the Roux limb with the harmonic scalpel and at approximately 2-2-1/2 cm anastomosis was created with a single firing of the 45mm blue load linear stapler. The staple line was inspected and was intact without bleeding. The common enterotomy was then closed with running 2-0 Vicryl begun at either end and tied centrally. The Ewall tube was then easily passed through the anastomosis and an outer anterior layer of running 2-0 Vicryl was placed. The Ewald tube was removed. With the outlet of the gastrojejunostomy clamped and under saline irrigation the assistant performed upper endoscopy and with the gastric pouch tensely distended with air-there was no evidence of leak on this test. The pouch was desufflated. The Vonita Moss defect was closed with running 2-0 silk. The abdomen was inspected for any evidence of bleeding or bowel injury and everything looked fine. The Nathanson retractor was removed under direct vision after coating the anastomosis with Tisseel tissue sealant. All CO2 was evacuated and trochars removed. Skin incisions were closed with  Dermabond. Sponge needle and instrument counts were correct. The patient was taken to the PACU in good condition.    [name]. Andrey Campanile, MD, FACS General, Bariatric, & Minimally Invasive Surgery [facility] Surgery, [place]

## 2012-10-22 NOTE — Progress Notes (Signed)
Text to Dr. Andrey Campanile re: patient was not called to do a bowel prep and ate solid food till 19: 30 .

## 2012-10-22 NOTE — Interval H&P Note (Signed)
History and Physical Interval Note:  10/22/2012 7:13 AM  Toni Palmer  has presented today for surgery, with the diagnosis of morbid obesity   The various methods of treatment have been discussed with the patient and family. After consideration of risks, benefits and other options for treatment, the patient has consented to  Procedure(s) (LRB) with comments: LAPAROSCOPIC ROUX-EN-Y GASTRIC BYPASS WITH UPPER ENDOSCOPY (N/A) as a surgical intervention .  The patient's history has been reviewed, patient examined, no change in status, stable for surgery.  I have reviewed the patient's chart and labs.  Questions were answered to the patient's satisfaction.    [name]. Andrey Campanile, MD, FACS General, Bariatric, & Minimally Invasive Surgery [facility] Surgery, [place]  [facility] M

## 2012-10-22 NOTE — Op Note (Signed)
Upper GI endoscopy is performed at the completion of laparoscopic Roux-en-Y gastric bypass by Dr. Wilson. The Olympus video endoscope was inserted into the upper esophagus and then passed under direct vision to the EG junction. The small gastric pouch was insufflated with air while the gastric outlet was clamped under irrigation by the operating surgeon. There was no evidence of leak. The anastomosis was visualized and was patent. Suture and staple lines were intact and without bleeding. The pouch was tubular and measured 4-5 cm in length. At the completion of the procedure the pouch was desufflated and the scope withdrawn. 

## 2012-10-22 NOTE — Transfer of Care (Signed)
Immediate Anesthesia Transfer of Care Note  Patient: Toni Palmer  Procedure(s) Performed: Procedure(s) (LRB) with comments: LAPAROSCOPIC ROUX-EN-Y GASTRIC BYPASS WITH UPPER ENDOSCOPY (N/A) EXCISION MASS () - small bowel mass  Patient Location: PACU  Anesthesia Type:General  Level of Consciousness: awake and alert   Airway & Oxygen Therapy: Patient Spontanous Breathing and Patient connected to face mask oxygen  Post-op Assessment: Report given to PACU RN and Post -op Vital signs reviewed and stable  Post vital signs: Reviewed and stable  Complications: No apparent anesthesia complications

## 2012-10-22 NOTE — Anesthesia Procedure Notes (Addendum)
Performed by: Anastasio Champion E   Procedure Name: Intubation Date/Time: 10/22/2012 7:32 AM Performed by: Illene Silver Pre-anesthesia Checklist: Patient identified, Emergency Drugs available, Suction available and Patient being monitored Patient Re-evaluated:Patient Re-evaluated prior to inductionOxygen Delivery Method: Circle system utilized Preoxygenation: Pre-oxygenation with 100% oxygen Intubation Type: IV induction and Combination inhalational/ intravenous induction Ventilation: Mask ventilation without difficulty and Oral airway inserted - appropriate to patient size Tube size: 7.5 mm Number of attempts: 2 Airway Equipment and Method: Bougie stylet Placement Confirmation: ETT inserted through vocal cords under direct vision,  positive ETCO2 and breath sounds checked- equal and bilateral Secured at: 21 cm Tube secured with: Tape Dental Injury: Teeth and Oropharynx as per pre-operative assessment  Difficulty Due To: Difficulty was anticipated, Difficult Airway- due to large tongue, Difficult Airway- due to reduced neck mobility, Difficult Airway- due to anterior larynx and Difficult Airway- due to limited oral opening Future Recommendations: Recommend- induction with short-acting agent, and alternative techniques readily available Comments: Used bougie with some difficulty by MD may just need to start off with  glidescope

## 2012-10-23 ENCOUNTER — Encounter (HOSPITAL_COMMUNITY): Payer: Self-pay | Admitting: General Surgery

## 2012-10-23 ENCOUNTER — Inpatient Hospital Stay (HOSPITAL_COMMUNITY): Payer: BC Managed Care – PPO

## 2012-10-23 LAB — CBC WITH DIFFERENTIAL/PLATELET
Basophils Absolute: 0 10*3/uL (ref 0.0–0.1)
Basophils Relative: 0 % (ref 0–1)
Eosinophils Absolute: 0.1 10*3/uL (ref 0.0–0.7)
Eosinophils Relative: 1 % (ref 0–5)
HCT: 31.5 % — ABNORMAL LOW (ref 36.0–46.0)
Hemoglobin: 10.6 g/dL — ABNORMAL LOW (ref 12.0–15.0)
Lymphocytes Relative: 28 % (ref 12–46)
Lymphs Abs: 2.5 10*3/uL (ref 0.7–4.0)
MCH: 30.6 pg (ref 26.0–34.0)
MCHC: 33.7 g/dL (ref 30.0–36.0)
MCV: 91 fL (ref 78.0–100.0)
Monocytes Absolute: 0.7 10*3/uL (ref 0.1–1.0)
Monocytes Relative: 8 % (ref 3–12)
Neutro Abs: 5.7 10*3/uL (ref 1.7–7.7)
Neutrophils Relative %: 64 % (ref 43–77)
Platelets: 276 10*3/uL (ref 150–400)
RBC: 3.46 MIL/uL — ABNORMAL LOW (ref 3.87–5.11)
RDW: 14.1 % (ref 11.5–15.5)
WBC: 8.9 10*3/uL (ref 4.0–10.5)

## 2012-10-23 LAB — GLUCOSE, CAPILLARY
Glucose-Capillary: 103 mg/dL — ABNORMAL HIGH (ref 70–99)
Glucose-Capillary: 107 mg/dL — ABNORMAL HIGH (ref 70–99)
Glucose-Capillary: 110 mg/dL — ABNORMAL HIGH (ref 70–99)
Glucose-Capillary: 111 mg/dL — ABNORMAL HIGH (ref 70–99)
Glucose-Capillary: 90 mg/dL (ref 70–99)
Glucose-Capillary: 92 mg/dL (ref 70–99)
Glucose-Capillary: 97 mg/dL (ref 70–99)

## 2012-10-23 LAB — HEMOGLOBIN AND HEMATOCRIT, BLOOD
HCT: 33.6 % — ABNORMAL LOW (ref 36.0–46.0)
Hemoglobin: 10.8 g/dL — ABNORMAL LOW (ref 12.0–15.0)

## 2012-10-23 MED ORDER — IOHEXOL 300 MG/ML  SOLN
50.0000 mL | Freq: Once | INTRAMUSCULAR | Status: AC | PRN
  Administered 2012-10-23: 50 mL via ORAL

## 2012-10-23 MED ORDER — AMBULATORY NON FORMULARY MEDICATION: 1.0000 | Status: AC | PRN

## 2012-10-23 NOTE — Telephone Encounter (Signed)
RX written and mailed to patient 

## 2012-10-23 NOTE — Progress Notes (Signed)
1 Day Post-Op  Subjective: Doing well. Pain ok. Sore. Walked several times  Objective: Vital signs in last 24 hours: Temp:  [98.1 F (36.7 C)-98.6 F (37 C)] 98.5 F (36.9 C) (12/04 0620) Pulse Rate:  [60-90] 64  (12/04 0620) Resp:  [15-23] 18  (12/04 0620) BP: (111-178)/(61-76) 111/68 mmHg (12/04 0620) SpO2:  [91 %-100 %] 94 % (12/04 0620) Weight:  [232 lb 6 oz (105.405 kg)] 232 lb 6 oz (105.405 kg) (12/03 1234) Last BM Date: 10/22/12  Intake/Output from previous day: 12/03 0701 - 12/04 0700 In: 3043.8 [I.V.:3043.8] Out: 1140 [Urine:1140] Intake/Output this shift:    Asleep, easily arousable. Appropriate cta ant Reg Obese, soft, incisions c/d/i No edema, +scd  Lab Results:   Basename 10/23/12 0418 10/22/12 1122  WBC 8.9 --  HGB 10.6* 12.4  HCT 31.5* 36.5  PLT 276 --   BMET  Basename 10/22/12 1310  NA --  K --  CL --  CO2 --  GLUCOSE --  BUN --  CREATININE 1.03  CALCIUM --   PT/INR No results found for this basename: LABPROT:2,INR:2 in the last 72 hours ABG No results found for this basename: PHART:2,PCO2:2,PO2:2,HCO3:2 in the last 72 hours  Studies/Results: No results found.  Anti-infectives: Anti-infectives     Start     Dose/Rate Route Frequency Ordered Stop   10/21/12 1630   cefOXitin (MEFOXIN) 2 g in dextrose 5 % 50 mL IVPB        2 g 100 mL/hr over 30 Minutes Intravenous 60 min pre-op 10/21/12 1630 10/22/12 0717          Assessment/Plan: s/p Procedure(s) (LRB) with comments: LAPAROSCOPIC ROUX-EN-Y GASTRIC BYPASS WITH UPPER ENDOSCOPY (N/A) EXCISION MASS () - small bowel mass  Doing well. Vitals good.  UGI this am. If ok adv to ice chips/water OOB, ambulate CPAP at night VTE prophy - cont scds, lovenox Anemia - if hgb drops to below 10 tonight. Will hold 12/5 am lovenox Blood sugars ok  Toni Palmer. Andrey Campanile, MD, FACS General, Bariatric, & Minimally Invasive Surgery [facility] [place] [facility], [place]   LOS: 1 day    Toni Palmer 10/23/2012

## 2012-10-23 NOTE — Care Management Note (Signed)
    Page 1 of 1   10/23/2012     12:17:21 PM   CARE MANAGEMENT NOTE 10/23/2012  Patient:  Toni Palmer, Toni Palmer   Account Number:  [id number]  Date Initiated:  10/23/2012  Documentation initiated by:  Lorenda Ishihara  Subjective/Objective Assessment:   56 yo female admitted s/p gastric bypass. PTA lived at home.     Action/Plan:   Home when stable   Anticipated DC Date:  10/26/2012   Anticipated DC Plan:  HOME/SELF CARE      DC Planning Services  CM consult      Choice offered to / List presented to:             Status of service:  Completed, signed off Medicare Important Message given?   (If response is "NO", the following Medicare IM given date fields will be blank) Date Medicare IM given:   Date Additional Medicare IM given:    Discharge Disposition:  HOME/SELF CARE  Per UR Regulation:  Reviewed for med. necessity/level of care/duration of stay  If discussed at Long Length of Stay Meetings, dates discussed:    Comments:

## 2012-10-23 NOTE — Progress Notes (Signed)
Pt alert and oriented; VSS; CBG's 92-138, will continue to monitor; denies any nausea or vomiting; denies burping or flatus; no BM; voiding without difficulty; ambulating in hallways without difficulty; using incentive spirometer as directed; c/o some abdominal soreness with relief from prn meds; for UGI today; remains NPO; plan of care discussed with pt and she verbalized understanding of; pt already has follow up appts with [facility] and [facility]; aware of support group and BELT program; discharge instructions given for pt to review; questions answered. GASTRIC BYPASS/SLEEVE DISCHARGE INSTRUCTIONS  Drs. Fredrik Rigger, [place], [place], and [place] Call if you have any problems.   Call [phone] and ask for the surgeon on call.    If you need immediate assistance come to the ER at [facility]. Tell the ER personnel that you are a new post-op gastric bypass patient. Signs and symptoms to report:   Severe vomiting or nausea. If you cannot tolerate clear liquids for longer than 1 day, you need to call your surgeon.    Abdominal pain which does not get better after taking your pain medication   Fever greater than 101 F degree   Difficulty breathing   Chest pain    Redness, swelling, drainage, or foul odor at incision sites    If your incisions open or pull apart   Swelling or pain in calf (lower leg)   Diarrhea, frequent watery, uncontrolled bowel movements.   Constipation, (no bowel movements for 3 days) if this occurs, Take Milk of Magnesia, 2 tablespoons by mouth, 3 times a day for 2 days if needed.  Call your doctor if constipation continues. Stop taking Milk of Magnesia once you have had a bowel movement. You may also use Miralax according to the label instructions.   Anything you consider "abnormal for you".   Normal side effects after Surgery:   Unable to sleep at night or concentrate   Irritability   Being tearful (crying) or depressed   These are common complaints, possibly related to your  anesthesia, stress of surgery and change in lifestyle, that usually go away a few weeks after surgery.  If these feelings continue, call your medical doctor.  Wound Care You may have surgical glue, steri-strips, or staples over your incisions after surgery.  Surgical glue:  Looks like a clear film over your incisions and will wear off gradually. Steri-strips: Strips of tape over your incisions. You may notice a yellowish color on the skin underneath the steri-strips. This is a substance used to make the steri-strips stick better. Do not pull the steri-strips off - let them fall off.  Staples: Cherlynn Polo may be removed before you leave the hospital. If you go home with staples, call [facility] [phone]) for an appointment with your surgeon's nurse to have staples removed in 7 - 10 days. Showering: You may shower two days after your surgery unless otherwise instructed by your surgeon. Wash gently around wounds with warm soapy water, rinse well, and gently pat dry.  If you have a drain, you may need someone to hold this while you shower. Avoid tub baths until staples are removed and incisions are healed.    Medications   Medications should be liquid or crushed if larger than the size of a dime.  Extended release pills should not be crushed.   Depending on the size and number of medications you take, you may need to stagger/change the time you take your medications so that you do not over-fill your pouch.  Make sure you follow-up with your primary care physician to make medication adjustments needed during rapid weight loss and life-style adjustment.   If you are diabetic, follow up with the doctor that prescribes your diabetes medication(s) within one week after surgery and check your blood sugar regularly.   Do not drive while taking narcotics!   Do not take acetaminophen (Tylenol) and Roxicet or Lortab Elixir at the same time since these pain medications contain  acetaminophen.  Diet at home: (First 2 Weeks) You will see the nutritionist two weeks after your surgery. She will advance your diet if you are tolerating liquids well. Once at home, if you have severe vomiting or nausea and cannot tolerate clear liquids lasting longer than 1 day, call your surgeon.  Begin high protein shake 2 ounces every 3 hours, 5 - 6 times per day.  Gradually increase the amount you drink as tolerated.  You may find it easier to slowly sip shakes throughout the day.  It is important to get your proteins in first.   Protein Shake   Drink at least 2 ounces of shake 5-6 times per day   Each serving of protein shakes should have a minimum of 15 grams of protein and no more than 5 grams of carbohydrate    Increase the amount of protein shake you drink as tolerated   Protein powder may be added to fluids such as non-fat milk or Lactaid milk (limit to 20 grams added protein powder per serving   The initial goal is to drink at least 8 ounces of protein shake/drink per day (or as directed by the nutritionist). Some examples of protein shakes are [employer], [employer], EAS Edge HP, and [employer]jury. Hydration   Gradually increase the amount of water and other liquids as tolerated (See Acceptable Fluids)   Gradually increase the amount of protein shake as tolerated     Sip fluids slowly and throughout the day   May use Sugar substitutes, use sparingly (limit to 6 - 8 packets per day). Your fluid goal is 64 ounces of fluid daily. It may take a few weeks to build up to this.         32 oz (or more) should be clear liquids and 32 oz (or more) should be full liquids.         Liquids should not contain sugar, caffeine, or carbonation! Acceptable Fluids Clear Liquids:   Water or Sugar-free flavored water, Fruit H2O   Decaffeinated coffee or tea (sugar-free)   Crystal Lite, Wyler's Lite, Minute Maid Lite   Sugar-free Jell-O   Bouillon or broth   Sugar-free Popsicle:   *Less than 20  calories each; Limit 1 per day   Full Liquids:              Protein Shakes/Drinks + 2 choices per day of other full liquids shown below.    Other full liquids must be: No more than 12 grams of Carbs per serving,  No more than 3 grams of Fat per serving   Strained low-fat cream soup   Non-Fat milk   Fat-free Lactaid Milk   Sugar-free yogurt (Dannon Lite & Fit) Vitamins and Minerals (Start 1 day after surgery unless otherwise directed)   2 Chewable Multivitamin / Multimineral Supplement (i.e. Centrum for Adults)   Chewable Calcium Citrate with Vitamin D-3. Take 1500 mg each day.           (Example: 3 Chewable Calcium Plus 600 with Vitamin D-3  can be found at [facility])         Vitamin B-12, 350 - 500 micrograms (oral tablet) each day   Do not mix multivitamins containing iron with calcium supplements; take 2 hours   apart   Do not substitute Tums (calcium carbonate) for your calcium   Menstruating women and those at risk for anemia may need extra iron. Talk with your doctor to see if you need additional iron.    If you need extra iron:  Total daily Iron recommendations (including Vitamins) = 50 - 100 mg Iron/day Do not stop taking or change any vitamins or minerals until you talk to your nutritionist or surgeon. Your nutritionist and / or physician must approve all vitamin and mineral supplements. Exercise For maximum success, begin exercising as soon as your doctor recommends. Make sure your physician approves any physical activity.   Depending on fitness level, begin with a simple walking program   Walk 5-15 minutes each day, 7 days per week.    Slowly increase until you are walking 30-45 minutes per day   Consider joining our BELT program. [phone] or email belt@[employer] .edu Things to remember:    You may have sexual relations when you feel comfortable. It is VERY important for female patients to use a reliable birth control method. Fertility often increases after surgery. Do not get  pregnant for at least 18 months.   It is very important to keep all follow up appointments with your surgeon, nutritionist, primary care physician, and behavioral health practitioner. After the first year, please follow up with your bariatric surgeon at least once a year in order to maintain best weight loss results.  [facility]: [phone] [facility]: [phone]   Free counseling is available for you and your family through collaboration between [facility] and [facility]. Please call [phone] and leave a message.    Consider purchasing a medical alert bracelet that says you had gastric bypass surgery.    [facility] has a free Bariatric Surgery Support Group that meets monthly, the 3rd Thursday, 6 pm, Classroom #1, [place]. You may register online at [email or web address], but registration is not necessary. Select Classes and Support Groups, Bariatric Surgery, or Call [phone]   Do not return to work or drive until cleared by your surgeon   Use your CPAP when sleeping if applicable   Do not lift anything greater than ten pounds for at least two weeks  Joen Laura, RN Bariatric Nurse coordinator

## 2012-10-23 NOTE — Addendum Note (Signed)
Addended byLiliana Cline on: 10/23/2012 11:00 AM   Modules accepted: Orders

## 2012-10-23 NOTE — Progress Notes (Signed)
Pt would like to self administer CPAP when ready, understands to call for RT if any assistance needed.

## 2012-10-23 NOTE — Progress Notes (Signed)
UGI results called to Dr. Andrey Campanile; orders received to begin POD #1 diet; Catie, RN aware of results and orders as well as pt. Joen Laura, RN Bariatric Nurse coordinator

## 2012-10-24 LAB — CBC WITH DIFFERENTIAL/PLATELET
Basophils Absolute: 0 10*3/uL (ref 0.0–0.1)
Basophils Relative: 0 % (ref 0–1)
Eosinophils Absolute: 0.2 10*3/uL (ref 0.0–0.7)
Eosinophils Relative: 2 % (ref 0–5)
HCT: 34.6 % — ABNORMAL LOW (ref 36.0–46.0)
Hemoglobin: 11.1 g/dL — ABNORMAL LOW (ref 12.0–15.0)
Lymphocytes Relative: 26 % (ref 12–46)
Lymphs Abs: 2.4 10*3/uL (ref 0.7–4.0)
MCH: 29.7 pg (ref 26.0–34.0)
MCHC: 32.1 g/dL (ref 30.0–36.0)
MCV: 92.5 fL (ref 78.0–100.0)
Monocytes Absolute: 0.6 10*3/uL (ref 0.1–1.0)
Monocytes Relative: 7 % (ref 3–12)
Neutro Abs: 6.2 10*3/uL (ref 1.7–7.7)
Neutrophils Relative %: 66 % (ref 43–77)
Platelets: 285 10*3/uL (ref 150–400)
RBC: 3.74 MIL/uL — ABNORMAL LOW (ref 3.87–5.11)
RDW: 14.2 % (ref 11.5–15.5)
WBC: 9.4 10*3/uL (ref 4.0–10.5)

## 2012-10-24 LAB — GLUCOSE, CAPILLARY
Glucose-Capillary: 92 mg/dL (ref 70–99)
Glucose-Capillary: 78 mg/dL (ref 70–99)

## 2012-10-24 MED ORDER — OXYCODONE-ACETAMINOPHEN 5-325 MG/5ML PO SOLN: 5.0000 mL | ORAL | Status: DC | PRN

## 2012-10-24 MED ORDER — PANTOPRAZOLE SODIUM 40 MG PO TBEC: 40.0000 mg | DELAYED_RELEASE_TABLET | Freq: Every day | ORAL | Status: DC

## 2012-10-24 NOTE — Discharge Summary (Signed)
Physician Discharge Summary  Toni Palmer ZOX:[id number] DOB: 05/05/1956 DOA: 10/22/2012  PCP: Marga Melnick, MD  Admit date: 10/22/2012 Discharge date: 10/24/2012  Recommendations for Outpatient Follow-up:  1. Follow up nutritionist   Follow-up Information    Follow up with Atilano Ina, MD,FACS. On 11/06/2012. (9:30 AM)    Contact information:   [address] [place] [address] [phone]         Discharge Diagnoses:  HYPOTHYROIDISM  DM w/o Complication Type II  Unspecified Anemia  OBSTRUCTIVE SLEEP APNEA  HYPERTENSION, ESSENTIAL NOS  Gastroparesis  OSTEOPOROSIS  Obesity, Class III, BMI 40-49.9 (morbid obesity)  Surgical Procedure: LAPAROSCOPIC ROUX EN Y GASTRIC BYPASS, UPPER ENDOSCOPY, RESECTION OF PROXIMAL JEJUNAL MASS  Discharge Condition: good Disposition: to home  Diet recommendation: postop gastric bypass diet  Filed Weights   10/22/12 0548 10/22/12 1234  Weight: 232 lb 6 oz (105.405 kg) 232 lb 6 oz (105.405 kg)    Hospital Course:  56 yo WF admitted for planned laparoscopic roux en y gastric bypass for obesity and associated medical conditions. Her postoperative course was unremarkable. Her postop swallow showed no evidence of a leak and she was started on water. On POD 2, she was advanced to protein shakes which she tolerated. Her vitals remained stable without tachycardia or fever. She had been maintained on VTE prophylaxis. Her incisions were clean, dry, intact. Her lungs were clear and heart regular. Abdomen was soft. She was ambulating without difficulty. She had used CPAP at night. She was deemed stable for discharge.    Discharge Instructions      Discharge Orders    Future Appointments: Provider: Department: Dept Phone: Center:   11/05/2012 4:00 PM Ndm[facility] Post-Op Class [facility] [phone] NDM   11/06/2012 9:30 AM Atilano Ina, MD,FACS [facility], [place] [phone]  None       Medication List     As of 10/24/2012  8:19 AM    STOP taking these medications         amphetamine-dextroamphetamine 30 MG 24 hr capsule   Commonly known as: ADDERALL XR      aspirin EC 81 MG tablet      Calcium-Vitamin D 500-125 MG-UNIT Tabs      clorazepate 15 MG tablet   Commonly known as: TRANXENE      metoCLOPramide 10 MG tablet   Commonly known as: REGLAN      naproxen sodium 220 MG tablet   Commonly known as: ANAPROX      vitamin C 500 MG tablet   Commonly known as: ASCORBIC ACID      TAKE these medications         albuterol 108 (90 BASE) MCG/ACT inhaler   Commonly known as: PROVENTIL HFA;VENTOLIN HFA   Inhale 2 puffs into the lungs 4 (four) times daily as needed. Wheezing      AMBULATORY NON FORMULARY MEDICATION   1 Device by Does not apply route as needed. Medication Name: [employer]rics food scale      DULoxetine 60 MG capsule   Commonly known as: CYMBALTA   Take 60 mg by mouth at bedtime.      fluticasone 50 MCG/ACT nasal spray   Commonly known as: FLONASE   Place 2 sprays into the nose daily.      Fluticasone-Salmeterol 250-50 MCG/DOSE Aepb   Commonly known as: ADVAIR   Inhale 1 puff into the lungs every 12 (twelve) hours.  furosemide 40 MG tablet   Commonly known as: LASIX   Take 40 mg by mouth every morning.      hydrocortisone butyrate 0.1 % Crea cream   Commonly known as: LUCOID   Apply 2 application topically 2 (two) times daily.      lamoTRIgine 150 MG tablet   Commonly known as: LAMICTAL   Take 150 mg by mouth at bedtime.      LEVOXYL 75 MCG tablet   Generic drug: levothyroxine   Take 75 mcg by mouth daily before breakfast.      loratadine 10 MG tablet   Commonly known as: CLARITIN   Take 10 mg by mouth daily.      losartan 50 MG tablet   Commonly known as: COZAAR   Take 50 mg by mouth at bedtime.      metFORMIN 500 MG tablet   Commonly known as: GLUCOPHAGE   Take 1,000 mg by mouth 2 (two) times daily with a meal.       MULTIVITAMIN PO   Take 1 tablet by mouth daily.      oxyCODONE-acetaminophen 5-325 MG/5ML solution   Commonly known as: ROXICET   Take 5-10 mLs by mouth every 4 (four) hours as needed.      pantoprazole 40 MG tablet   Commonly known as: PROTONIX   Take 1 tablet (40 mg total) by mouth daily.      simvastatin 20 MG tablet   Commonly known as: ZOCOR   Take 20 mg by mouth at bedtime.      traZODone 100 MG tablet   Commonly known as: DESYREL   Take 100 mg by mouth At bedtime.      Vitamin D 2000 UNITS tablet   Take 2,000 Units by mouth daily.        Follow-up Information    Follow up with Atilano Ina, MD,FACS. On 11/06/2012. (9:30 AM)    Contact information:   [address] [place] [address] [phone]           The results of significant diagnostics from this hospitalization (including imaging, microbiology, ancillary and laboratory) are listed below for reference.    Significant Diagnostic Studies: Dg Ugi W/water Sol Cm  10/23/2012  *RADIOLOGY REPORT*  Clinical Data:Postop gastric bypass.  ESOPHAGUS/BARIUM SWALLOW/TABLET STUDY  Fluoroscopy Time:1.49 minutes.  Contrast: 50 ml Omnipaque-300.  Comparison: None.  Findings: Water-soluble study demonstrates normal distal esophagus. Small gastric pouch noted.  There is prompt emptying into the small bowel.  No evidence of leak.  The study was continued distally show no obstruction or leak at the distal anastomosis.  IMPRESSION: Normal postoperative appearance.  No obstruction or leak.   Original Report Authenticated By: Charlett Nose, M.D.     Microbiology: Recent Results (from the past 240 hour(s))  SURGICAL PCR SCREEN     Status: Abnormal   Collection Time   10/14/12  1:10 PM      Component Value Range Status Comment   MRSA, PCR NEGATIVE  NEGATIVE Final    Staphylococcus aureus POSITIVE (*) NEGATIVE Final      Labs: Basic Metabolic Panel:  Lab 10/22/12 9563  NA --  K --  CL --  CO2 --   GLUCOSE --  BUN --  CREATININE 1.03  CALCIUM --  MG --  PHOS --   CBC:  Lab 10/24/12 0525 10/23/12 1706 10/23/12 0418 10/22/12 1122  WBC 9.4 -- 8.9 --  NEUTROABS 6.2 -- 5.7 --  HGB 11.1* 10.8* 10.6* 12.4  HCT 34.6* 33.6* 31.5* 36.5  MCV 92.5 -- 91.0 --  PLT 285 -- 276 --  CBG:  Lab 10/24/12 0806 10/24/12 0351 10/23/12 2357 10/23/12 2001 10/23/12 1542  GLUCAP 78 92 90 97 103*    Active Problems:  HYPOTHYROIDISM  DM w/o Complication Type II  Unspecified Anemia  OBSTRUCTIVE SLEEP APNEA  HYPERTENSION, ESSENTIAL NOS  Gastroparesis  OSTEOPOROSIS  Obesity, Class III, BMI 40-49.9 (morbid obesity)   Time coordinating discharge:  15 min Signed:  Atilano Ina, MD [facility], [place] [phone] 10/24/2012, 8:19 AM

## 2012-10-24 NOTE — Progress Notes (Signed)
Pt alert and oriented; VSS; CBG's 90-111; using CPAP; denies any nausea or vomiting; tolerating water well; will advance to protein shakes; denies burping or flatus; voiced concern about not having BM; instructed if no BM in 3 days or uncomfortable then she could take 2 tablespoons of milk of magnesia 3 times a day or Miralax; Dr. Andrey Campanile in to see pt; voiding without difficulty; c/o some abdominal discomfort with relief from prn meds; ambulating in hallways without difficulty; using incentive spirometer as directed; pt already has follow up appts with [facility] and [facility]; aware of BELT and support group; discharge instructions reviewed and pt verbalized understanding of; questions answered. GASTRIC BYPASS/SLEEVE DISCHARGE INSTRUCTIONS  Drs. Fredrik Rigger, Hoxworth, Wilson, and Erma Call if you have any problems.   Call [phone] and ask for the surgeon on call.    If you need immediate assistance come to the ER at [facility]. Tell the ER personnel that you are a new post-op gastric bypass patient. Signs and symptoms to report:   Severe vomiting or nausea. If you cannot tolerate clear liquids for longer than 1 day, you need to call your surgeon.    Abdominal pain which does not get better after taking your pain medication   Fever greater than 101 F degree   Difficulty breathing   Chest pain    Redness, swelling, drainage, or foul odor at incision sites    If your incisions open or pull apart   Swelling or pain in calf (lower leg)   Diarrhea, frequent watery, uncontrolled bowel movements.   Constipation, (no bowel movements for 3 days) if this occurs, Take Milk of Magnesia, 2 tablespoons by mouth, 3 times a day for 2 days if needed.  Call your doctor if constipation continues. Stop taking Milk of Magnesia once you have had a bowel movement. You may also use Miralax according to the label instructions.   Anything you consider "abnormal for you".   Normal side effects after Surgery:   Unable to  sleep at night or concentrate   Irritability   Being tearful (crying) or depressed   These are common complaints, possibly related to your anesthesia, stress of surgery and change in lifestyle, that usually go away a few weeks after surgery.  If these feelings continue, call your medical doctor.  Wound Care You may have surgical glue, steri-strips, or staples over your incisions after surgery.  Surgical glue:  Looks like a clear film over your incisions and will wear off gradually. Steri-strips: Strips of tape over your incisions. You may notice a yellowish color on the skin underneath the steri-strips. This is a substance used to make the steri-strips stick better. Do not pull the steri-strips off - let them fall off.  Staples: Cherlynn Polo may be removed before you leave the hospital. If you go home with staples, call [facility] [phone]) for an appointment with your surgeon's nurse to have staples removed in 7 - 10 days. Showering: You may shower two days after your surgery unless otherwise instructed by your surgeon. Wash gently around wounds with warm soapy water, rinse well, and gently pat dry.  If you have a drain, you may need someone to hold this while you shower. Avoid tub baths until staples are removed and incisions are healed.    Medications   Medications should be liquid or crushed if larger than the size of a dime.  Extended release pills should not be crushed.   Depending on the size and number of  medications you take, you may need to stagger/change the time you take your medications so that you do not over-fill your pouch.    Make sure you follow-up with your primary care physician to make medication adjustments needed during rapid weight loss and life-style adjustment.   If you are diabetic, follow up with the doctor that prescribes your diabetes medication(s) within one week after surgery and check your blood sugar regularly.   Do not drive while taking  narcotics!   Do not take acetaminophen (Tylenol) and Roxicet or Lortab Elixir at the same time since these pain medications contain acetaminophen.  Diet at home: (First 2 Weeks) You will see the nutritionist two weeks after your surgery. She will advance your diet if you are tolerating liquids well. Once at home, if you have severe vomiting or nausea and cannot tolerate clear liquids lasting longer than 1 day, call your surgeon.  Begin high protein shake 2 ounces every 3 hours, 5 - 6 times per day.  Gradually increase the amount you drink as tolerated.  You may find it easier to slowly sip shakes throughout the day.  It is important to get your proteins in first.   Protein Shake   Drink at least 2 ounces of shake 5-6 times per day   Each serving of protein shakes should have a minimum of 15 grams of protein and no more than 5 grams of carbohydrate    Increase the amount of protein shake you drink as tolerated   Protein powder may be added to fluids such as non-fat milk or Lactaid milk (limit to 20 grams added protein powder per serving   The initial goal is to drink at least 8 ounces of protein shake/drink per day (or as directed by the nutritionist). Some examples of protein shakes are [employer], Dillard's, EAS Edge HP, and Unjury. Hydration   Gradually increase the amount of water and other liquids as tolerated (See Acceptable Fluids)   Gradually increase the amount of protein shake as tolerated     Sip fluids slowly and throughout the day   May use Sugar substitutes, use sparingly (limit to 6 - 8 packets per day). Your fluid goal is 64 ounces of fluid daily. It may take a few weeks to build up to this.         32 oz (or more) should be clear liquids and 32 oz (or more) should be full liquids.         Liquids should not contain sugar, caffeine, or carbonation! Acceptable Fluids Clear Liquids:   Water or Sugar-free flavored water, Fruit H2O   Decaffeinated coffee or tea  (sugar-free)   Crystal Lite, Wyler's Lite, Minute Maid Lite   Sugar-free Jell-O   Bouillon or broth   Sugar-free Popsicle:   *Less than 20 calories each; Limit 1 per day   Full Liquids:              Protein Shakes/Drinks + 2 choices per day of other full liquids shown below.    Other full liquids must be: No more than 12 grams of Carbs per serving,  No more than 3 grams of Fat per serving   Strained low-fat cream soup   Non-Fat milk   Fat-free Lactaid Milk   Sugar-free yogurt (Dannon Lite & Fit) Vitamins and Minerals (Start 1 day after surgery unless otherwise directed)   2 Chewable Multivitamin / Multimineral Supplement (i.e. Centrum for Adults)   Chewable Calcium Citrate with Vitamin  D-3. Take 1500 mg each day.           (Example: 3 Chewable Calcium Plus 600 with Vitamin D-3 can be found at [facility])         Vitamin B-12, 350 - 500 micrograms (oral tablet) each day   Do not mix multivitamins containing iron with calcium supplements; take 2 hours   apart   Do not substitute Tums (calcium carbonate) for your calcium   Menstruating women and those at risk for anemia may need extra iron. Talk with your doctor to see if you need additional iron.    If you need extra iron:  Total daily Iron recommendations (including Vitamins) = 50 - 100 mg Iron/day Do not stop taking or change any vitamins or minerals until you talk to your nutritionist or surgeon. Your nutritionist and / or physician must approve all vitamin and mineral supplements. Exercise For maximum success, begin exercising as soon as your doctor recommends. Make sure your physician approves any physical activity.   Depending on fitness level, begin with a simple walking program   Walk 5-15 minutes each day, 7 days per week.    Slowly increase until you are walking 30-45 minutes per day   Consider joining our BELT program. [phone] or email belt@[employer] .edu Things to remember:    You may have sexual relations when you feel  comfortable. It is VERY important for female patients to use a reliable birth control method. Fertility often increases after surgery. Do not get pregnant for at least 18 months.   It is very important to keep all follow up appointments with your surgeon, nutritionist, primary care physician, and behavioral health practitioner. After the first year, please follow up with your bariatric surgeon at least once a year in order to maintain best weight loss results.  [facility]: [phone] [facility]: [phone]   Free counseling is available for you and your family through collaboration between [facility] and [place]. Please call [phone] and leave a message.    Consider purchasing a medical alert bracelet that says you had gastric bypass surgery.    [facility] has a free Bariatric Surgery Support Group that meets monthly, the 3rd Thursday, 6 pm, Classroom #1, [place]. You may register online at [email or web address], but registration is not necessary. Select Classes and Support Groups, Bariatric Surgery, or Call [phone]   Do not return to work or drive until cleared by your surgeon   Use your CPAP when sleeping if applicable   Do not lift anything greater than ten pounds for at least two weeks  Joen Laura, RN Bariatric Nurse Coordinator

## 2012-10-28 ENCOUNTER — Telehealth (INDEPENDENT_AMBULATORY_CARE_PROVIDER_SITE_OTHER): Payer: Self-pay

## 2012-10-28 NOTE — Telephone Encounter (Signed)
The pt is 6 days post gastric bypass.  She went home on Thursday.  Dr Andrey Campanile put her on Pantoprazole 40mg  daily.  She says it isn't helping.  It is keeping her up at night.  She takes the pill in the morning and it kicks in after 2 to 3 hours.  She isn't vomiting but does have reflux.  She is having no pain from surgery.  Before surgery she took Aciphex, Nexium, and Reglan.  She isn't sure what would work now after the surgery.  Please advise.  [facility] uses [facility] on [address].

## 2012-10-29 MED ORDER — RABEPRAZOLE SODIUM 20 MG PO TBEC: 20.0000 mg | DELAYED_RELEASE_TABLET | Freq: Every day | ORAL | Status: DC

## 2012-10-29 MED ORDER — ESOMEPRAZOLE MAGNESIUM 40 MG PO CPDR: 40.0000 mg | DELAYED_RELEASE_CAPSULE | Freq: Every day | ORAL | Status: DC

## 2012-10-29 NOTE — Telephone Encounter (Signed)
She can stop protonix. Resume aciphex and nexium. Do not take reglan

## 2012-10-29 NOTE — Telephone Encounter (Signed)
Patient made aware. She states she does not have any more refills on her aciphex and nexium. Prescriptions sent electronically to [facility].

## 2012-10-29 NOTE — Addendum Note (Signed)
Addended byLiliana Cline on: 10/29/2012 10:41 AM   Modules accepted: Orders

## 2012-11-01 ENCOUNTER — Telehealth (INDEPENDENT_AMBULATORY_CARE_PROVIDER_SITE_OTHER): Payer: Self-pay

## 2012-11-01 NOTE — Telephone Encounter (Signed)
Pt called stating she is having continued reflux and heart burn. Pt states meds have not improved symptoms. Pt states she is only taking in water and protein shakes at this time. She states no fever, no abd pain. Pt voiding well. Symptoms reviewed with Dr Andrey Campanile via phone. Per Dr Andrey Campanile pt advised to take only small sips at a time and not large amounts at one sitting. She is to increase taking her aciphex 40 mg twice a day. Pt also advised no po's 2hrs prior to sleeping. Pt advised we will call in carafate and to also add this to the meds she is taking but to allow at least 1-2hrs between this and her other meds.  Carafate 1gm  per dose qid as elixer, 400cc bottle. Rx called to pharmacy [phone].

## 2012-11-05 ENCOUNTER — Encounter: Payer: BC Managed Care – PPO | Attending: General Surgery | Admitting: *Deleted

## 2012-11-05 VITALS — Ht 64.0 in | Wt 226.6 lb

## 2012-11-05 DIAGNOSIS — Z01818 Encounter for other preprocedural examination: Secondary | ICD-10-CM | POA: Insufficient documentation

## 2012-11-05 DIAGNOSIS — Z713 Dietary counseling and surveillance: Secondary | ICD-10-CM | POA: Insufficient documentation

## 2012-11-05 DIAGNOSIS — E66813 Obesity, class 3: Secondary | ICD-10-CM

## 2012-11-05 NOTE — Patient Instructions (Addendum)
Patient to follow Phase 3A-Soft, High Protein Diet and follow-up at [facility] in 6 weeks for 2 months post-op nutrition visit for diet advancement. 

## 2012-11-05 NOTE — Progress Notes (Addendum)
Bariatric Class:  Appt start time: 1600 end time:  1700.  2 Week Post-Operative Nutrition Class  Patient was seen on 11/05/2012 for Post-Operative Nutrition education [facility].   Surgery date: 10/22/12 Surgery type: RYGB Start weight at [facility]: 248.0 lbs (07/27/12)   Weight today: 226.6 lbs  Weight change: 21.4 lbs Total weight lost: 21.4 lbs BMI: 38.9 kg/m^2  The following the learning objectives were met by the patient during this course:  Identifies Phase 3A (Soft, High Proteins) Dietary Goals and will begin from 2 weeks post-operatively to 2 months post-operatively  Identifies appropriate sources of fluids and proteins   States protein recommendations and appropriate sources post-operatively  Identifies the need for appropriate texture modifications, mastication, and bite sizes when consuming solids  Identifies appropriate multivitamin and calcium sources post-operatively  Describes the need for physical activity post-operatively and will follow MD recommendations  States when to call healthcare provider regarding medication questions or post-operative complications  Handouts given during class include:  Phase 3A: Soft, High Protein Diet Handout  Follow-Up Plan: Patient will follow-up at [facility] in 6 weeks for 2 months post-op nutrition visit for diet advancement per MD.

## 2012-11-06 ENCOUNTER — Ambulatory Visit (INDEPENDENT_AMBULATORY_CARE_PROVIDER_SITE_OTHER): Payer: BC Managed Care – PPO | Admitting: General Surgery

## 2012-11-06 ENCOUNTER — Encounter (INDEPENDENT_AMBULATORY_CARE_PROVIDER_SITE_OTHER): Payer: Self-pay | Admitting: General Surgery

## 2012-11-06 VITALS — BP 112/66 | HR 68 | Temp 97.0°F | Resp 16 | Ht 64.0 in | Wt 224.8 lb

## 2012-11-06 DIAGNOSIS — Z9884 Bariatric surgery status: Secondary | ICD-10-CM

## 2012-11-06 DIAGNOSIS — Z09 Encounter for follow-up examination after completed treatment for conditions other than malignant neoplasm: Secondary | ICD-10-CM

## 2012-11-06 MED ORDER — RABEPRAZOLE SODIUM 20 MG PO TBEC: 20.0000 mg | DELAYED_RELEASE_TABLET | Freq: Two times a day (BID) | ORAL | Status: DC

## 2012-11-06 MED ORDER — SUCRALFATE 1 GM/10ML PO SUSP: 1.0000 g | Freq: Three times a day (TID) | ORAL | Status: DC

## 2012-11-06 NOTE — Progress Notes (Signed)
Subjective:     Patient ID: Toni Palmer, female   DOB: [birth year], 56 y.o.   MRN: [id number]  HPI 56 year old Caucasian female comes in for a followup appointment after undergoing laparoscopic Roux-en-Y gastric bypass. Her surgery was on December 3. She states that she has had a significant problem reflux since discharge. She has called in several times and we have tried several things. She states the Nexium and proton next did not help. She took it for about 3 days for twice a day without any improvement in her symptoms. She resumed AcipHex which she thinks has helped a little bit. He also recently added Carafate which she says has helped somewhat. She says that it's worse at night. She describes it as a knifelike sensation and it burns all the way up into her mouth. She also complains of dry heaves. Her symptoms are worse at night. It is affecting her sleep. She denies any trouble swallowing pills or swallowing liquids. She denies any fevers or chills. She has had some nausea. her most serious complaint is her reflux. She's also had some constipation. She denies abdominal pain. She is walking 5 times a week for about 20 minutes at a time. She is taking her supplements. She has been on a liquid diet until today.  Review of Systems     Objective:   Physical Exam BP 112/66  Pulse 68  Temp 97 F (36.1 C) (Temporal)  Resp 16  Ht 5\' 4"  (1.626 m)  Wt 224 lb 12.8 oz (101.969 kg)  BMI 38.59 kg/m2  Gen: alert, NAD, non-toxic appearing Pupils: equal, no scleral icterus Pulm: Lungs clear to auscultation, symmetric chest rise CV: regular rate and rhythm Abd: soft, nontender, nondistended. Well-healed trocar sites. No cellulitis. No incisional hernia Ext: no edema, no calf tenderness Skin: no rash, no jaundice     Assessment:     Status post laparoscopic Roux-en-Y gastric bypass    Plan:     Her total weight loss has been 17 pounds. I encouraged her to continue walking on a daily basis.  Her vital signs are stable. She is nontoxic appearing. I tried to reassure her that the reflux and heartburn should improve. I reminded her that she should not eat or drink anything within 2 hours of going to bed. I encouraged her to place several pillows behind her while sleeping. We will continue AcipHex twice a day as well as Carafate. I told her she could resume the Reglan and see if this helps her. if She is still symptomatic after about 6 weeks we will investigate with an upper GI or upper endoscopy. Followup 4-6 weeks  [name]. Andrey Campanile, MD, FACS General, Bariatric, & Minimally Invasive Surgery [facility], [place]

## 2012-11-06 NOTE — Patient Instructions (Signed)
Keep up with the walking Resume reglan

## 2012-11-07 ENCOUNTER — Telehealth (INDEPENDENT_AMBULATORY_CARE_PROVIDER_SITE_OTHER): Payer: Self-pay | Admitting: General Surgery

## 2012-11-07 NOTE — Telephone Encounter (Signed)
I called [employer] [phone] and got prior approval on patient's aciphex. Called [facility] and made them aware [phone] approval received.

## 2012-12-02 ENCOUNTER — Encounter: Payer: Self-pay | Admitting: Gastroenterology

## 2012-12-12 ENCOUNTER — Ambulatory Visit (INDEPENDENT_AMBULATORY_CARE_PROVIDER_SITE_OTHER): Payer: BC Managed Care – PPO | Admitting: General Surgery

## 2012-12-17 ENCOUNTER — Encounter: Payer: BC Managed Care – PPO | Attending: General Surgery | Admitting: *Deleted

## 2012-12-17 ENCOUNTER — Encounter: Payer: Self-pay | Admitting: *Deleted

## 2012-12-17 VITALS — Ht 64.0 in | Wt 214.0 lb

## 2012-12-17 DIAGNOSIS — Z01818 Encounter for other preprocedural examination: Secondary | ICD-10-CM | POA: Insufficient documentation

## 2012-12-17 DIAGNOSIS — Z713 Dietary counseling and surveillance: Secondary | ICD-10-CM | POA: Insufficient documentation

## 2012-12-17 NOTE — Patient Instructions (Addendum)
Goals:  Follow Phase 3B: High Protein + Non-Starchy Vegetables  Eat 3-6 small meals/snacks, every 3-5 hrs  Increase lean protein foods to meet 60-80g goal  Increase fluid intake to 64-100 oz  Aim for >30 min of physical activity daily  Contact MD regarding thyroid medication and taking Furosemide (Lasix) on as needed basis.   Surgery date: 10/22/12 Surgery type: RYGB Start weight at [facility]: 248.0 lbs (07/27/12)   Weight today: 214.0 lbs  Weight change: 12.6 lbs Total weight lost: 34.0 lbs  TANITA  BODY COMP RESULTS  07/27/13 12/17/12   BMI (kg/m^2) 42.6 36.7   Fat Mass (lbs) 130.5 101.0   Fat Free Mass (lbs) 117.5 113.0   Total Body Water (lbs) 86.0 82.5

## 2012-12-17 NOTE — Progress Notes (Addendum)
  Follow-up visit:  8 Weeks Post-Operative RYGB Surgery  Medical Nutrition Therapy:  Appt start time: 1500 end time:  1530.  Primary concerns today: Post-operative Bariatric Surgery Nutrition Management. Reports severe GERD for 1st two weeks after surgery, but has now resolved. Protein intake low, but getting fluids in.  Reports decreased appetite. Chronic yeast infection under abdominal skin fold; treating with OTC creams.  Surgery date: 10/22/12 Surgery type: RYGB Start weight at [facility]: 248.0 lbs (07/27/12)   Weight today: 214.0 lbs  Weight change: 12.6 lbs Total weight lost: 34.0 lbs  Weight goal: 146 lbs % goal met: 33%  TANITA  BODY COMP RESULTS  07/27/13 12/17/12   BMI (kg/m^2) 42.6 36.7   Fat Mass (lbs) 130.5 101.0   Fat Free Mass (lbs) 117.5 113.0   Total Body Water (lbs) 86.0 82.5   24-hr recall: B (AM): 12 oz Protein shake (EAS) - 20g Snk (AM): NONE  L (PM): 2 oz roasted or grilled chicken - 15g Snk (PM): NONE  D (PM): 2 oz meatballs w/ tomato sauce - 15g Snk (PM): NONE  Fluid intake: 80 oz water, 11 oz protein shake Estimated total protein intake: 50g  Medications: See medication list.  Off all DM medications Supplementation: Taking as directed  CBG monitoring: None  Average CBG per patient: None Last patient reported A1c: None  Using straws: No Drinking while eating: No Hair loss: Mild if any Carbonated beverages:  One flat diet caffeine free soda since surgery N/V/D/C:  None, but reports BM ~q4d Dumping syndrome: None  Recent physical activity:  Walks 3 days a week @ 30 min  Progress Towards Goal(s):  In progress.  Samples given during visit include:  Freedavite MVI: 2 bottles @ 5 tabs/bottle Lot # [id number]; Exp: 01/17    Nutritional Diagnosis:  [place]-3.3 Overweight/obesity related to past poor dietary habits and physical inactivity as evidenced by patient w/ recent RYGB surgery following dietary guidelines for continued weight loss.    Intervention:   Nutrition education/diet advancement.  Monitoring/Evaluation:  Dietary intake, exercise, and body weight. Follow up in 3 months for 6 month post-op visit.

## 2012-12-18 ENCOUNTER — Encounter (INDEPENDENT_AMBULATORY_CARE_PROVIDER_SITE_OTHER): Payer: Self-pay | Admitting: General Surgery

## 2012-12-27 ENCOUNTER — Ambulatory Visit (INDEPENDENT_AMBULATORY_CARE_PROVIDER_SITE_OTHER): Payer: BC Managed Care – PPO | Admitting: General Surgery

## 2012-12-27 ENCOUNTER — Encounter (INDEPENDENT_AMBULATORY_CARE_PROVIDER_SITE_OTHER): Payer: Self-pay | Admitting: General Surgery

## 2012-12-27 VITALS — BP 128/70 | HR 76 | Temp 97.8°F | Resp 18 | Ht 64.0 in | Wt 210.0 lb

## 2012-12-27 DIAGNOSIS — Z09 Encounter for follow-up examination after completed treatment for conditions other than malignant neoplasm: Secondary | ICD-10-CM

## 2012-12-27 NOTE — Patient Instructions (Signed)
Take miralax for the infrequent stools Try changing your pace while walking

## 2012-12-27 NOTE — Progress Notes (Signed)
Subjective:     Patient ID: Toni Palmer, female   DOB: Sep 04, 1956, 57 y.o.   MRN: [id number]  HPI 57 year old Caucasian female comes in for her second postoperative appointment after undergoing laparoscopic Roux-en-Y gastric bypass on 10/22/2012. I last saw her on December 18. At that time she was having significant problems with reflux and heartburn. She states that her reflux and heartburn resolved about 3 days after her last office appointment. She is no longer taking any reflux medication. She is not taking any Reglan or Carafate. She denies any abdominal pain. She denies any nausea or regurgitation. She denies any diarrhea. She is only having a bowel movement about every third or fourth day. She denies any flatulence. She denies any bloating. She will occasionally have a problem with a skin rash on her lower abdominal wall which is been chronic for her. It responds to hydrocortisone ointment. She reports that she is more energetic. She states that she does feel like her food volume is restricted. She reports taking her supplements regularly. She states that she saw one of her doctors he stopped one of her blood pressure medications and switched her to a different low-dose blood pressure medication. She states that she was also told to stop taking her metformin. She is still using her CPAP machine. She states that she is walking about 3 times a week for about 30-40 minutes at a time. She is walking at the same pace.  Review of Systems     Objective:   Physical Exam BP 128/70  Pulse 76  Temp 97.8 F (36.6 C) (Temporal)  Resp 18  Ht 5\' 4"  (1.626 m)  Wt 210 lb (95.255 kg)  BMI 36.05 kg/m2  Gen: alert, NAD, non-toxic appearing Pupils: equal, no scleral icterus Pulm: Lungs clear to auscultation, symmetric chest rise CV: regular rate and rhythm Abd: soft, nontender, nondistended. Well-healed trocar sites. No cellulitis. No incisional hernia Ext: no edema, no calf tenderness Skin: no rash,  no jaundice     Assessment:     obesity BMI 41.4 -->36 DM II - resolved GERD - resolved OSA on CPAP - stable HTN - improved Joint pain - improved Hypothyroidism Asthma    Plan:     Overall I think she is doing very well. I'm very happy that her reflux and indigestion has resolved. She was encouraged to continue taking her supplements. Her total weight loss starting from her initial visit to the office has been 38 pounds. Her immediate weight loss since her preoperative appointment has been roughly 31 pounds. I congratulated her on her weight loss. We discussed strategies that will help maintain an as well as improve her weight loss which is regular exercise. We discussed interval speed walking. I encouraged her to take MiraLAX as needed for infrequent bowel movements. Since she is doing so well I will see her in 3 months  [name]. Andrey Campanile, MD, FACS General, Bariatric, & Minimally Invasive Surgery [facility], [place]

## 2013-01-04 ENCOUNTER — Other Ambulatory Visit: Payer: Self-pay

## 2013-01-06 ENCOUNTER — Encounter (INDEPENDENT_AMBULATORY_CARE_PROVIDER_SITE_OTHER): Payer: Self-pay

## 2013-01-15 ENCOUNTER — Encounter: Payer: BC Managed Care – PPO | Attending: General Surgery | Admitting: *Deleted

## 2013-01-15 ENCOUNTER — Encounter: Payer: Self-pay | Admitting: *Deleted

## 2013-01-15 VITALS — Ht 64.0 in | Wt 203.5 lb

## 2013-01-15 DIAGNOSIS — Z713 Dietary counseling and surveillance: Secondary | ICD-10-CM | POA: Insufficient documentation

## 2013-01-15 DIAGNOSIS — Z01818 Encounter for other preprocedural examination: Secondary | ICD-10-CM | POA: Insufficient documentation

## 2013-01-15 NOTE — Progress Notes (Signed)
  Follow-up visit:  12 Weeks Post-Operative RYGB Surgery  Medical Nutrition Therapy:  Appt start time: 1200 end time:  1230.  Primary concerns today: Post-operative Bariatric Surgery Nutrition Management. Toni Palmer returns for f/u with an additional wt loss of 10.5 lbs.  Protein intake low continues to be a little low, but is getting fluids in.  Reports continued decreased appetite. Chronic yeast infection under abdominal skin fold continue.  Surgery date: 10/22/12 Surgery type: RYGB Start weight at [facility]: 248.0 lbs (07/27/12)   Weight today: 203.5 lbs  Weight change: 10.5 lbs Total weight lost: 44.5 lbs  Weight goal: 146 lbs % goal met: 44%  TANITA  BODY COMP RESULTS  07/27/13 12/17/12 01/15/13   BMI (kg/m^2) 42.6 36.7 34.9   Fat Mass (lbs) 130.5 101.0 96.0   Fat Free Mass (lbs) 117.5 113.0 107.5   Total Body Water (lbs) 86.0 82.5 78.5   24-hr recall: B (AM): 11 oz Protein shake (EAS) - 20g Snk (AM): NONE  L (PM):  1 oz Ham, 1/4 c broccoli salad (Honey Baked Ham) - 10g Snk (PM): NONE  D (PM):  2 oz protein, non-starchy veggies - 15-20g Snk (PM): 4-5 Cheez-its  Fluid intake: 60-80 oz water, 11 oz protein shake Estimated total protein intake: ~50g  Medications:  MD added Miralax daily Supplementation: Taking as directed  CBG monitoring: None  Average CBG per patient: None Last patient reported A1c: None; Going to endocrinologist in March 2014.  Using straws: No Drinking while eating: No Hair loss: Mild if any Carbonated beverages:  No N/V/D/C:  None. Dumping syndrome: None  Recent physical activity:  Walks 3 days a week @ 15-30 min; reports pain in ankles, knees, and hips.   Progress Towards Goal(s):  In progress.  Samples given during visit include:  Premier Protein Shake (strawberries & cream): 1 ea Lot: [id number]; Exp: 12/02/13  OneTouch Ultra Mini meter: 1 starter kit Lot: [id number]; Exp: 04/15   Nutritional Diagnosis:  [place]-3.3 Overweight/obesity related to past  poor dietary habits and physical inactivity as evidenced by patient w/ recent RYGB surgery following dietary guidelines for continued weight loss.    Intervention:  Nutrition education/diet advancement.  Monitoring/Evaluation:  Dietary intake, exercise, and body weight. Follow up in 3 months for 6 month post-op visit.

## 2013-01-15 NOTE — Patient Instructions (Addendum)
Goals:  Follow Phase 3B: High Protein + Non-Starchy Vegetables  Eat 3-6 small meals/snacks, every 3-5 hrs  Increase lean protein foods to meet 60-80g goal  Increase fluid intake to 64oz +  Add 15 grams of carbohydrate (fruit, whole grain) with meals  Avoid drinking 15 minutes before, during and 30 minutes after eating  Aim for >30 min of physical activity daily  

## 2013-01-16 ENCOUNTER — Ambulatory Visit (INDEPENDENT_AMBULATORY_CARE_PROVIDER_SITE_OTHER): Payer: BC Managed Care – PPO | Admitting: General Surgery

## 2013-03-19 ENCOUNTER — Encounter: Payer: Self-pay | Admitting: Internal Medicine

## 2013-03-27 ENCOUNTER — Encounter (INDEPENDENT_AMBULATORY_CARE_PROVIDER_SITE_OTHER): Payer: Self-pay | Admitting: General Surgery

## 2013-03-27 ENCOUNTER — Ambulatory Visit (INDEPENDENT_AMBULATORY_CARE_PROVIDER_SITE_OTHER): Payer: BC Managed Care – PPO | Admitting: General Surgery

## 2013-03-27 VITALS — BP 136/74 | HR 72 | Temp 97.5°F | Resp 16 | Ht 64.0 in | Wt 187.8 lb

## 2013-03-27 DIAGNOSIS — Z9884 Bariatric surgery status: Secondary | ICD-10-CM

## 2013-03-27 NOTE — Patient Instructions (Signed)
We will get your labs check to monitor for vitamin deficiencies Consider getting a pedometer and tracking your daily step count and setting weekly goals to increase your daily step count Consider using a recumbent bicycle Participate in water aerobics

## 2013-03-27 NOTE — Progress Notes (Signed)
Subjective:     Patient ID: Toni Palmer, female   DOB: [birth year], 58 y.o.   MRN: [id number]  HPI 57 year old Caucasian female comes in for six-month followup after undergoing laparoscopic Roux-en-Y gastric bypass on 10/22/2012. I last saw in the office in February 2013 at that time she weighed 210 pounds. Today she weighs 187 pounds. Total weight loss since beginning the process is around 60 pounds.  She states that she has done well since her last visit. She denies any reflux or abdominal pain. She has had some mild nausea but nothing of note. She is having a bowel movement every other day. She reports more energy. She denies any trouble with certain foods. She states that she has tried to exercise and is currently walking 3 times a week for about 20 minutes at a time. However she reports back pain bilateral hip and knee pain which is limiting her activity. She is still using her CPAP. She reports trouble with sleeping. She is only averaging about 4 hours a night. She is down to one blood pressure medication.  PMHx, PSHx, SOCHx, FAMHx, ALL reviewed   Review of Systems 10 point ROS performed and negative except for HPI    Objective:   Physical Exam BP 136/74  Pulse 72  Temp(Src) 97.5 F (36.4 C) (Temporal)  Resp 16  Ht 5\' 4"  (1.626 m)  Wt 187 lb 12.8 oz (85.186 kg)  BMI 32.22 kg/m2  Gen: alert, NAD, non-toxic appearing Pupils: equal, no scleral icterus Pulm: Lungs clear to auscultation, symmetric chest rise CV: regular rate and rhythm Abd: soft, nontender, nondistended. Well-healed trocar sites. No cellulitis. No incisional hernia Ext: no edema, no calf tenderness Skin: no rash, no jaundice     Assessment:     S/p LRYGB  obesity BMI 41.4 -->32.24  DM II - resolved  GERD - resolved  OSA on CPAP - stable  HTN - improved  Joint pain - stable  Hypothyroidism  Asthma     Plan:     I congratulated her on her weight loss. She has lost an additional 27 pounds since  February.  With respect to her sleep irregularities I do think it's potential she may need to be evaluated by the pulmonologist to see if her CPAP settings need to be titrated.  She is 6 months out from surgery so she is due for surveillance labs.  We discussed the importance of physical activity. She states that they're getting ready to open the pool up in her complex and she'll be doing water aerobics. We discussed perhaps trying a recumbent bicycle which would be low impact on her joints. I also discussed getting a pedometer and tracking her daily steep count and making weekly goals to increase her step count. She is taking her multivitamin supplements.  Followup 3 months  [name]. Andrey Campanile, MD, FACS General, Bariatric, & Minimally Invasive Surgery [facility], [place]

## 2013-04-09 ENCOUNTER — Encounter: Payer: Self-pay | Admitting: Internal Medicine

## 2013-04-09 ENCOUNTER — Ambulatory Visit (INDEPENDENT_AMBULATORY_CARE_PROVIDER_SITE_OTHER): Payer: BC Managed Care – PPO | Admitting: Internal Medicine

## 2013-04-09 VITALS — BP 116/72 | HR 80 | Ht 64.0 in | Wt 182.8 lb

## 2013-04-09 DIAGNOSIS — G4733 Obstructive sleep apnea (adult) (pediatric): Secondary | ICD-10-CM

## 2013-04-09 NOTE — Progress Notes (Signed)
Subjective:    Patient ID: Toni Palmer, female    DOB: 12-18-1955, 57 y.o.   MRN: [id number] HPI 55 WF with known hx of Asthma, AR, and OSA  05/11/11 Acute OV - NP visit Pt presents for an acute work in visit. Complains of prod cough with yellow/green mucus, increased SOB, wheezing, sinus pressue/congestion, chills/sweats x1.5weeks . Has teeth pain with bending forward. Lots of sinus congestion and drainage. She was previous on  on advair , flonase  Stopped several months ago. Would like to restart. Needs new rx. Congestion is getting worse.  Pt informs me she is currently undergoing preliminary workup for Lap Band surgery.   06/15/11- 20 yo F Followed for asthma, allergic rhinitis, asthma, OSA, complicated by DM, GERD, obesity Feeling better after NP acute visit last month. Now well except dull pain behind angle of jaw on right and mild hoarseness. She feels better having restarted Advair and flonase. Anticipating lap band surgery. Prior PFT She is using CPAP all night every night- no problem 12 cwp/ [facility]. Nasal pillows mask.  Uses rescue inhaler before walking or water aerobics.   04/09/13-  52 yo F Followed for asthma, allergic rhinitis, asthma, OSA, complicated by DM, GERD, obesity FOLLOWS FOR: wears CPAP 12/ [facility] every night for about 6-7 hours; follow up since gastric bypass. Weight down 66 pounds since gastric bypass in December. Still feels she cannot sleep without CPAP. Family tells her she is not snoring. Continues Adderall ER for fibromyalgia from Dr. Evelene Croon.  ROS-see HPI Constitutional:   No-   weight loss, night sweats, fevers, chills, fatigue, lassitude. HEENT:   No-  headaches, difficulty swallowing, tooth/dental problems, sore throat,       No-  sneezing, itching, ear ache, nasal congestion, post nasal drip,  CV:  No-   chest pain, orthopnea, PND, swelling in lower extremities, anasarca,                                  dizziness, palpitations Resp: No-    shortness of breath with exertion or at rest.              No-   productive cough,  No non-productive cough,  No- coughing up of blood.              No-   change in color of mucus.  No- wheezing.   Skin: No-   rash or lesions. GI:  No-   heartburn, indigestion, abdominal pain, nausea, vomiting,  GU: N MS:  No-   joint pain or swelling.   Neuro-     nothing unusual Psych:  No- change in mood or affect. No depression or anxiety.  No memory loss.   Objective:  General- Alert, Oriented, Affect-appropriate, Distress- none acute   obese Skin- rash-none, lesions- none, excoriation- none Lymphadenopathy- none Head- atraumatic            Eyes- Gross vision intact, PERRLA, conjunctivae clear secretions            Ears- Hearing, canals normal            Nose- Clear, No-Septal dev, mucus, polyps, erosion, perforation             Throat- Mallampati III posteriorly placed, mucosa clear , drainage- none, tonsils- atrophic Neck- flexible , trachea midline, no stridor , thyroid nl, carotid no bruit Chest - symmetrical excursion , unlabored  Heart/CV- RRR , no murmur , no gallop  , no rub, nl s1 s2                           - JVD- none , edema- none, stasis changes- none, varices- none           Lung- clear to P&A, wheeze- none, cough- none , dullness-none, rub- none           Chest wall-  Abd-  Br/ Gen/ Rectal- Not done, not indicated Extrem- cyanosis- none, clubbing, none, atrophy- none, strength- nl Neuro- grossly intact to observation  Assessment & Plan:

## 2013-04-09 NOTE — Patient Instructions (Addendum)
Please call as needed 

## 2013-04-15 ENCOUNTER — Encounter: Payer: Self-pay | Admitting: *Deleted

## 2013-04-15 ENCOUNTER — Encounter: Payer: BC Managed Care – PPO | Attending: Internal Medicine | Admitting: *Deleted

## 2013-04-15 VITALS — Ht 64.0 in | Wt 183.0 lb

## 2013-04-15 DIAGNOSIS — Z713 Dietary counseling and surveillance: Secondary | ICD-10-CM | POA: Insufficient documentation

## 2013-04-15 DIAGNOSIS — Z9884 Bariatric surgery status: Secondary | ICD-10-CM | POA: Insufficient documentation

## 2013-04-15 DIAGNOSIS — Z09 Encounter for follow-up examination after completed treatment for conditions other than malignant neoplasm: Secondary | ICD-10-CM | POA: Insufficient documentation

## 2013-04-15 LAB — COMPREHENSIVE METABOLIC PANEL
ALT: 10 U/L (ref 0–35)
AST: 14 U/L (ref 0–37)
Albumin: 3.9 g/dL (ref 3.5–5.2)
Alkaline Phosphatase: 85 U/L (ref 39–117)
BUN: 17 mg/dL (ref 6–23)
CO2: 25 mEq/L (ref 19–32)
Calcium: 9.6 mg/dL (ref 8.4–10.5)
Chloride: 104 mEq/L (ref 96–112)
Creat: 0.94 mg/dL (ref 0.50–1.10)
Glucose, Bld: 121 mg/dL — ABNORMAL HIGH (ref 70–99)
Potassium: 4 mEq/L (ref 3.5–5.3)
Sodium: 140 mEq/L (ref 135–145)
Total Bilirubin: 0.4 mg/dL (ref 0.3–1.2)
Total Protein: 6.4 g/dL (ref 6.0–8.3)

## 2013-04-15 LAB — HEMOGLOBIN A1C
Hgb A1c MFr Bld: 6.1 % — ABNORMAL HIGH (ref <5.7)
Mean Plasma Glucose: 128 mg/dL — ABNORMAL HIGH (ref <117)

## 2013-04-15 LAB — CBC WITH DIFFERENTIAL/PLATELET
Basophils Absolute: 0 10*3/uL (ref 0.0–0.1)
Basophils Relative: 0 % (ref 0–1)
Eosinophils Absolute: 0.1 10*3/uL (ref 0.0–0.7)
Eosinophils Relative: 1 % (ref 0–5)
HCT: 38.5 % (ref 36.0–46.0)
Hemoglobin: 12.7 g/dL (ref 12.0–15.0)
Lymphocytes Relative: 25 % (ref 12–46)
Lymphs Abs: 2.2 10*3/uL (ref 0.7–4.0)
MCH: 29.5 pg (ref 26.0–34.0)
MCHC: 33 g/dL (ref 30.0–36.0)
MCV: 89.3 fL (ref 78.0–100.0)
Monocytes Absolute: 0.4 10*3/uL (ref 0.1–1.0)
Monocytes Relative: 4 % (ref 3–12)
Neutro Abs: 6.1 10*3/uL (ref 1.7–7.7)
Neutrophils Relative %: 70 % (ref 43–77)
Platelets: 402 10*3/uL — ABNORMAL HIGH (ref 150–400)
RBC: 4.31 MIL/uL (ref 3.87–5.11)
RDW: 13.9 % (ref 11.5–15.5)
WBC: 8.7 10*3/uL (ref 4.0–10.5)

## 2013-04-15 LAB — LIPID PANEL
Cholesterol: 181 mg/dL (ref 0–200)
HDL: 46 mg/dL (ref >39)
LDL Cholesterol: 101 mg/dL — ABNORMAL HIGH (ref 0–99)
Total CHOL/HDL Ratio: 3.9 Ratio
Triglycerides: 169 mg/dL — ABNORMAL HIGH (ref <150)
VLDL: 34 mg/dL (ref 0–40)

## 2013-04-15 LAB — IBC PANEL
%SAT: 17 % — ABNORMAL LOW (ref 20–55)
TIBC: 318 ug/dL (ref 250–470)
UIBC: 263 ug/dL (ref 125–400)

## 2013-04-15 LAB — IRON: Iron: 55 ug/dL (ref 42–145)

## 2013-04-15 LAB — MAGNESIUM: Magnesium: 1.8 mg/dL (ref 1.5–2.5)

## 2013-04-15 LAB — VITAMIN B12: Vitamin B-12: 433 pg/mL (ref 211–911)

## 2013-04-15 LAB — FOLATE: Folate: 15.8 ng/mL

## 2013-04-15 NOTE — Progress Notes (Signed)
  Follow-up visit:  6 Months Post-Operative RYGB Surgery  Medical Nutrition Therapy:  Appt start time: 1200   End time: 1230.  Primary concerns today: Post-operative Bariatric Surgery Nutrition Management. Toni Palmer returns for f/u with an additional wt loss of 20.5 lbs. States her fluid intake decreased because she is "tired of drinking".   Reports continued decreased appetite. Chronic yeast infection under abdominal skin fold continues. Also reports rash sides of body where arm skin brushes.   Surgery date: 10/22/12 Surgery type: RYGB Start weight at [facility]: 248.0 lbs (07/27/12)   Weight today: 183.0 lbs  Weight change: 20.5 lbs Total weight lost: 65.0 lbs  Weight goal: 146 lbs % goal met: 64%  TANITA  BODY COMP RESULTS  07/27/13 12/17/12 01/15/13 04/15/13   BMI (kg/m^2) 42.6 36.7 34.9 31.4   Fat Mass (lbs) 130.5 101.0 96.0 75.5   Fat Free Mass (lbs) 117.5 113.0 107.5 107.5   Total Body Water (lbs) 86.0 82.5 78.5 78.5   24-hr recall:  Eating out more; usually orders protein off children's menu. Also eats grilled nuggets from [employer] (2.5 nuggets at a meal)  Fluid intake: 35 oz water, 11 oz protein shake - 45-50 oz Estimated total protein intake: ~50g  Medications:  MD added Miralax daily Supplementation: Taking as directed  CBG monitoring: None  Average CBG per patient: None Last patient reported A1c:  Pt does not recall at time of visit  Using straws: No Drinking while eating: No Hair loss: Mild if any Carbonated beverages:  No N/V/D/C:  None. Dumping syndrome: None  Recent physical activity:  Walks 3 days a week @ 15-30 min  Progress Towards Goal(s):  In progress.  Samples given during visit include:   Nascobal B12 Spray 500 mcg: 1 box Lot: [id number]; Exp: 10/15  Nutritional Diagnosis:  [place]-3.3 Overweight/obesity related to past poor dietary habits and physical inactivity as evidenced by patient w/ recent RYGB surgery following dietary guidelines for continued weight  loss.    Intervention:  Nutrition education/diet advancement.  Monitoring/Evaluation:  Dietary intake, exercise, and body weight. Follow up in 6 months for 12 month post-op visit.

## 2013-04-15 NOTE — Patient Instructions (Addendum)
Goals:  Continue to follow Phase 3B: High Protein + Non-Starchy Vegetables  Increase lean protein foods to meet 60-80g goal  Increase fluid intake to 64oz +  May add 15 grams of carbohydrate (fruit, whole grain) with meals  Avoid drinking 15 minutes before, during and 30 minutes after eating  Aim for >30 min of physical activity daily  Let me know if you would like to send in the prescription for Nascobal.   Take Nascobal 1 spray in 1 nostril only 1 time a week

## 2013-04-16 LAB — VITAMIN D 25 HYDROXY (VIT D DEFICIENCY, FRACTURES): Vit D, 25-Hydroxy: 38 ng/mL (ref 30–89)

## 2013-04-21 ENCOUNTER — Encounter: Payer: Self-pay | Admitting: Internal Medicine

## 2013-04-21 NOTE — Assessment & Plan Note (Signed)
Great compliance and control. We discussed how CPAP adjustments would become appropriate as she loses weight. Despite 66 pounds weight loss so far, pressure still seems comfortable and mask seals well.

## 2013-05-16 ENCOUNTER — Encounter: Payer: Self-pay | Admitting: Gastroenterology

## 2013-06-25 ENCOUNTER — Ambulatory Visit (INDEPENDENT_AMBULATORY_CARE_PROVIDER_SITE_OTHER): Payer: BC Managed Care – PPO | Admitting: General Surgery

## 2013-07-16 ENCOUNTER — Ambulatory Visit: Payer: BC Managed Care – PPO | Admitting: *Deleted

## 2013-08-06 ENCOUNTER — Ambulatory Visit: Payer: BC Managed Care – PPO | Admitting: Internal Medicine

## 2013-08-08 ENCOUNTER — Ambulatory Visit (INDEPENDENT_AMBULATORY_CARE_PROVIDER_SITE_OTHER): Payer: BC Managed Care – PPO | Admitting: General Surgery

## 2013-08-12 ENCOUNTER — Ambulatory Visit (INDEPENDENT_AMBULATORY_CARE_PROVIDER_SITE_OTHER): Payer: BC Managed Care – PPO | Admitting: Internal Medicine

## 2013-08-12 ENCOUNTER — Encounter: Payer: Self-pay | Admitting: Internal Medicine

## 2013-08-12 VITALS — BP 119/78 | HR 76 | Temp 98.3°F | Wt 150.6 lb

## 2013-08-12 DIAGNOSIS — H6120 Impacted cerumen, unspecified ear: Secondary | ICD-10-CM

## 2013-08-12 DIAGNOSIS — J310 Chronic rhinitis: Secondary | ICD-10-CM

## 2013-08-12 DIAGNOSIS — H9190 Unspecified hearing loss, unspecified ear: Secondary | ICD-10-CM

## 2013-08-12 DIAGNOSIS — H6123 Impacted cerumen, bilateral: Secondary | ICD-10-CM

## 2013-08-12 DIAGNOSIS — H9193 Unspecified hearing loss, bilateral: Secondary | ICD-10-CM

## 2013-08-12 NOTE — Progress Notes (Signed)
  Subjective:    Patient ID: Toni Palmer, female    DOB: 07-13-1956, 57 y.o.   MRN: [id number]  HPI  She has had sensation of the right ear being plugged up followed by decreased hearing for 6 weeks. The only possible trigger was ?water getting  into the ear while showering.  Symptoms were stable until 9/22 when she noted some popping.  She's had some nasal congestion and postnasal drainage. There's been no treatment to date.    Review of Systems  She specifically denies associated tinnitus or otic discharge  She's had no fever, chills, sweats or sweats.  She denies rhinosinusitis symptoms such as frontal sinus pain, facial pain, nasal purulence,significant  dental pain, or otic discharge.  She has no significant cough .  He also denies extrinsic symptoms of itchy, watery eyes, or sneezing.     Objective:   Physical Exam General appearance:good health ;well nourished; no acute distress or increased work of breathing is present.  No  lymphadenopathy about the head, neck, or axilla noted.   Eyes: No conjunctival inflammation or lid edema is present.   Ears:  External ear exam shows no significant lesions or deformities.  Otoscopic examination reveals wax bilaterally.  Nose:  External nasal examination shows no deformity or inflammation. Nasal mucosa are pink and moist without lesions or exudates. No septal dislocation or deviation.No obstruction to airflow.   Oral exam: Dental hygiene is good; lips and gums are healthy appearing.There is minimal oropharyngeal erythema; no exudate noted.   Neck:  No deformities,  masses, or tenderness noted.     Heart:  Normal rate and regular rhythm. S1 and S2 normal without gallop, murmur, click, rub or other extra sounds.   Lungs:Chest clear to auscultation; no wheezes, rhonchi,rales ,or rubs present.No increased work of breathing.    Extremities:  No cyanosis, edema, or clubbing  noted    Skin: Warm & dry .         Assessment &  Plan:  #1 cerumen impactions. Complete resolution of impaction on the right with soaking and gavage. Regimen provided to address chronic recurrent cerumen impactions  #2 rhinitis without evidence of rhinosinusitis  See orders and recommendations

## 2013-08-12 NOTE — Patient Instructions (Addendum)
Please do not use Q-tips as we discussed. Should wax build up occur, please put 2-3 drops of mineral oil in the affected  ear at night to soften the wax .Cover the canal with a  cotton ball to prevent the oil from staining bed linens. In the morning fill the ear canal with hydrogen peroxide & lie in the opposite lateral decubitus position(on the side opposite the affected ear)  for 10-15 minutes. After allowing this period of time for the peroxide to dissolve the wax ;shower and use the thinnest washrag available to wick out the wax. If both ears are involved ; alternate this treatment from ear to ear each night until no wax is found on the washrag. Plain Mucinex (NOT D) for thick secretions ;force NON dairy fluids .   Nasal cleansing in the shower as discussed with lather of mild shampoo.After 10 seconds wash off lather while  exhaling through nostrils. Make sure that all residual soap is removed to prevent irritation.  Fluticasone 1 spray in each nostril twice a day as needed. Use the "crossover" technique into opposite nostril spraying toward opposite ear @ 45 degree angle, not straight up into nostril.  Use a Neti pot daily only  as needed for significant sinus congestion; going from open side to congested side . Plain Allegra (NOT D )  160 daily , Loratidine 10 mg , OR Zyrtec 10 mg @ bedtime  as needed for itchy eyes & sneezing.    

## 2013-08-18 ENCOUNTER — Encounter: Payer: Self-pay | Admitting: *Deleted

## 2013-08-18 ENCOUNTER — Encounter: Payer: BC Managed Care – PPO | Attending: General Surgery | Admitting: *Deleted

## 2013-08-18 VITALS — Ht 64.0 in | Wt 148.0 lb

## 2013-08-18 DIAGNOSIS — Z713 Dietary counseling and surveillance: Secondary | ICD-10-CM | POA: Insufficient documentation

## 2013-08-18 DIAGNOSIS — E669 Obesity, unspecified: Secondary | ICD-10-CM | POA: Insufficient documentation

## 2013-08-18 NOTE — Patient Instructions (Addendum)
Goals:  Continue to follow Phase 3B: High Protein + Non-Starchy Vegetables  Increase lean protein foods to meet 60-80g goal  Increase fluid intake to 64oz +  May add 15 grams of carbohydrate (fruit, whole grain) with meals  Continue biotin to help with hair loss  Make sure to supplement iron since the FreedaVite MVI does not have much.

## 2013-08-18 NOTE — Progress Notes (Addendum)
  Follow-up visit:  9 Months Post-Operative RYGB Surgery  Medical Nutrition Therapy:  Appt start time: 1145   End time: 1230.  Primary concerns today: Post-operative Bariatric Surgery Nutrition Management. Toni Palmer returns for f/u with an additional wt loss of 35.0 lbs. Reports she is down to a size 10 from a 22-24. Protein and fluid intake slightly low, though she has a petite frame and appears to be doing well. Some hair loss reported for last 1.5 mos.  Denies N/V/D/C.   Surgery date: 10/22/12 Surgery type: RYGB Start weight at [facility]: 248.0 lbs (07/27/12)   Weight today: 148.0 lbs  Weight change: 35.0 lbs Total weight lost: 100.0 lbs  Weight goal: 146 lbs % goal met: 98%  TANITA  BODY COMP RESULTS  07/27/13 12/17/12 01/15/13 04/15/13 08/18/13   BMI (kg/m^2) 42.6 36.7 34.9 31.4 25.4   Fat Mass (lbs) 130.5 101.0 96.0 75.5 52.0   Fat Free Mass (lbs) 117.5 113.0 107.5 107.5 96.0   Total Body Water (lbs) 86.0 82.5 78.5 78.5 70.5   24-hr recall:  No change to intake  Fluid intake: 35 oz water, 11 oz protein shake (21-22g pro) = 45-50 oz Estimated total protein intake: ~ 50g  Medications:  MD added Miralax daily Supplementation: Taking as directed  CBG monitoring: 2 days/week  Average FBG per patient: 102 mg Last patient reported A1c:  Pt does not recall at time of visit. Scheduled for one next month.  Using straws: No Drinking while eating: No Hair loss:  Yes; for last 1.5 months - ? If low iron Carbonated beverages:  12 oz diet Pepsi daily - Discussed risks N/V/D/C:  None. Dumping syndrome: None  Recent physical activity:  Walks 3 days a week @ 15-30 min - very painful for her d/t fibromyalgia pain  Progress Towards Goal(s):  In progress.  Samples given during visit include:   BariActiv:  MVI - Lot: [id number]; Exp: 05/16 = 1 pkt Calcium - Lot: [id number]; Exp: 05/16 = 1 pkt Iron - Lot: [id number]; Exp: 05/16 = 10 pkts   PB2: 1 pkt Lot: NONE; Exp: 12/05/13   Freedavite: 1 bottle @ 5  tabs/bottle Lot: [id number]; Exp: 03/17  Nutritional Diagnosis:  [name]-3.3 Overweight/obesity related to past poor dietary habits and physical inactivity as evidenced by patient w/ recent RYGB surgery following dietary guidelines for continued weight loss.    Intervention:  Nutrition education/reinforcement.  Monitoring/Evaluation:  Dietary intake, exercise, and body weight. Follow up in 6 months for 18 month post-op visit.

## 2013-09-03 ENCOUNTER — Ambulatory Visit (INDEPENDENT_AMBULATORY_CARE_PROVIDER_SITE_OTHER): Payer: BC Managed Care – PPO | Admitting: General Surgery

## 2013-09-03 ENCOUNTER — Telehealth (INDEPENDENT_AMBULATORY_CARE_PROVIDER_SITE_OTHER): Payer: Self-pay | Admitting: General Surgery

## 2013-09-03 ENCOUNTER — Encounter (INDEPENDENT_AMBULATORY_CARE_PROVIDER_SITE_OTHER): Payer: Self-pay | Admitting: General Surgery

## 2013-09-03 VITALS — BP 110/64 | HR 78 | Resp 16 | Ht 64.0 in | Wt 145.2 lb

## 2013-09-03 DIAGNOSIS — Z87898 Personal history of other specified conditions: Secondary | ICD-10-CM

## 2013-09-03 DIAGNOSIS — Z9884 Bariatric surgery status: Secondary | ICD-10-CM

## 2013-09-03 DIAGNOSIS — Z8639 Personal history of other endocrine, nutritional and metabolic disease: Secondary | ICD-10-CM

## 2013-09-03 NOTE — Patient Instructions (Signed)
You are doing great! Keep up with the great work Continue to take your supplements Get your lab work drawn

## 2013-09-03 NOTE — Progress Notes (Signed)
Subjective:     Patient ID: Toni Palmer, female   DOB: 12/31/1955, 57 y.o.   MRN: [id number]  HPI 57 year old Caucasian female comes in for nine-month followup after undergoing laparoscopic Roux-en-Y gastric bypass on 10/22/2012. I last saw in the office in May 2014 at that time she weighed 187 pounds. Today she weighs 145.2 pounds. Total weight loss since beginning the process is around 102 pounds!  She states that she has done well since her last visit. She denies any reflux, nausea, or abdominal pain. She is having a bowel movement every day since taking Miralax.  She denies any trouble with certain foods. She states that she has tried to exercise and is currently walking 3 times a week for about 20 minutes at a time. However she still reports she is limited by back pain bilateral hip and knee pain which is limiting her activity. She is still using her CPAP. She reports better sleep. She did follow up with Dr Maple Hudson regarding her OSA in May and no changes were made. She is only averaging about 6 hours a night. She is down to one blood pressure medication. She states that she had some balance issues before surgery due to her fibromyalgia. She states that she has noticed that her balance is getting worse. She also reports worsening memory problems. She states that sometimes she forgets what she is going to stay. She also complains of dry mouth. She states that she is taking her supplements every day and as directed. She states that she still has some fatigue. She states that her psychiatrist increased her Adderall recently.  PMHx, PSHx, SOCHx, FAMHx, ALL reviewed   Review of Systems 10 point ROS performed and negative except for HPI    Objective:   Physical Exam BP 110/64  Pulse 78  Resp 16  Ht 5\' 4"  (1.626 m)  Wt 145 lb 3.2 oz (65.862 kg)  BMI 24.91 kg/m2  Gen: alert, NAD, non-toxic appearing Pupils: equal, no scleral icterus Pulm: Lungs clear to auscultation, symmetric chest rise CV:  regular rate and rhythm Abd: soft, nontender, nondistended. Well-healed trocar sites. No cellulitis. No incisional hernia Ext: no edema, no calf tenderness Skin: no rash, no jaundice; Redundant skin     Assessment:     S/p LRYGB  obesity BMI 42.5 -->32.24  DM II - resolved  GERD - resolved  OSA on CPAP - stable  HTN - improved  Joint pain - stable  Hypothyroidism  Asthma     Plan:     I congratulated her on her weight loss. She has lost an additional 42 pounds since May. To date she has lost about 102 pounds.  I'm not sure as to the etiology of her memory problems or her worsening balance. My suspicion for vitamin or nutritional deficiency is low so she is taking her supplements. However it is a possibility. Therefore I recommended that we check her screening surveillance labs now. We will also check her thyroid function level. She is scheduled to see her endocrinologist Dr. Talmage Nap next week  Encouraged her to follow with her physician who monitors her fibromyalgia.  Followup 3 months  [name]. Andrey Campanile, MD, FACS General, Bariatric, & Minimally Invasive Surgery [facility] [place]      24.92

## 2013-09-03 NOTE — Addendum Note (Signed)
Addended by: June Leap on: 09/03/2013 11:29 AM   Modules accepted: Orders

## 2013-09-03 NOTE — Telephone Encounter (Signed)
Mailed lab orders for pt to have labs drawn and pt verbalized agreement to fast NPO 8 hrs prior to labs being drawn...per EW labs were ordered this visit

## 2013-09-05 ENCOUNTER — Ambulatory Visit (INDEPENDENT_AMBULATORY_CARE_PROVIDER_SITE_OTHER): Payer: BC Managed Care – PPO | Admitting: Family Medicine

## 2013-09-05 ENCOUNTER — Telehealth: Payer: Self-pay | Admitting: *Deleted

## 2013-09-05 VITALS — BP 110/70 | HR 71 | Temp 97.6°F | Resp 18 | Ht 63.0 in | Wt 147.2 lb

## 2013-09-05 DIAGNOSIS — S01309A Unspecified open wound of unspecified ear, initial encounter: Secondary | ICD-10-CM

## 2013-09-05 DIAGNOSIS — H9209 Otalgia, unspecified ear: Secondary | ICD-10-CM

## 2013-09-05 DIAGNOSIS — S01311A Laceration without foreign body of right ear, initial encounter: Secondary | ICD-10-CM

## 2013-09-05 NOTE — Telephone Encounter (Signed)
Received call from patient and she stated that her earring pulled down causing her ear lobe to bleed. She stated that she thought she may need stitches. Patient was advised to go to an urgent care or ER.

## 2013-09-05 NOTE — Patient Instructions (Addendum)
Wear a lightweight stud earring and be sure to turn it and clean the area daily.  Please come in for suture removal in 10- 14 days. If any sign of infection such as redness, pus, heat or swelling/ pain please come back right away

## 2013-09-05 NOTE — Progress Notes (Signed)
Patient ID: MALESSA ZARTMAN MRN: [id number], DOB: 1956-03-10, 57 y.o. Date of Encounter: 09/05/2013, 11:50 AM   PROCEDURE NOTE: Verbal consent obtained. Sterile technique employed. Numbing: Anesthesia obtained with 2% lidocaine plain.   Cleansed with soap and water. Irrigated.  Wound explored, no deep structures involved, no foreign bodies.   Wound repaired with # 3 SI sutures using 5-0 ethilon.   Hemostasis obtained. Wound cleansed and dressed.  Wound care instructions including precautions covered with patient. Handout given.  Anticipate suture removal in 10-14 days.   Rhoderick Moody, PA-C 09/05/2013 11:50 AM
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
## 2013-09-22 ENCOUNTER — Ambulatory Visit (INDEPENDENT_AMBULATORY_CARE_PROVIDER_SITE_OTHER): Payer: BC Managed Care – PPO | Admitting: Family Medicine

## 2013-09-22 VITALS — BP 122/72 | HR 73 | Temp 97.6°F | Resp 16 | Ht 64.0 in | Wt 145.6 lb

## 2013-09-22 DIAGNOSIS — IMO0002 Reserved for concepts with insufficient information to code with codable children: Secondary | ICD-10-CM

## 2013-09-22 DIAGNOSIS — S01319S Laceration without foreign body of unspecified ear, sequela: Secondary | ICD-10-CM
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
## 2013-09-25 ENCOUNTER — Other Ambulatory Visit: Payer: Self-pay

## 2013-11-05 ENCOUNTER — Encounter (INDEPENDENT_AMBULATORY_CARE_PROVIDER_SITE_OTHER): Payer: Self-pay | Admitting: General Surgery

## 2014-01-16 ENCOUNTER — Encounter: Payer: BC Managed Care – PPO | Attending: Internal Medicine | Admitting: Dietician

## 2014-01-16 VITALS — Ht 64.0 in | Wt 121.5 lb

## 2014-01-16 DIAGNOSIS — Z09 Encounter for follow-up examination after completed treatment for conditions other than malignant neoplasm: Secondary | ICD-10-CM | POA: Insufficient documentation

## 2014-01-16 DIAGNOSIS — Z713 Dietary counseling and surveillance: Secondary | ICD-10-CM | POA: Insufficient documentation

## 2014-01-16 DIAGNOSIS — Z9884 Bariatric surgery status: Secondary | ICD-10-CM | POA: Insufficient documentation

## 2014-01-16 DIAGNOSIS — E669 Obesity, unspecified: Secondary | ICD-10-CM | POA: Insufficient documentation

## 2014-01-16 NOTE — Patient Instructions (Addendum)
Goals:  Continue to follow Phase 3B: High Protein + Non-Starchy Vegetables  Continue having lean protein foods to meet 60-80g goal  Continue having fluid intake to 64oz +  Add snacks in the morning and afternoon - combine carbohydrates with protein  Add 2 tablespoons of any salad dressing to salad  Add peanut butter, nuts, and cheese for snacks  Continue biotin to help with hair loss  Goal is to gain about 10 lbs.

## 2014-01-16 NOTE — Progress Notes (Signed)
  Follow-up visit:  9 Months Post-Operative RYGB Surgery  Medical Nutrition Therapy:  Appt start time: 3664   End time: 4034.  Primary concerns today: Post-operative Bariatric Surgery Nutrition Management. Toni Palmer returns for f/u with an additional wt loss of 26.5 lbs fat loss. States she was not trying to lost more weight. Body fat is currently at 15.9%. Appears thin.  States that she is feeling stress since her granddaughter is in the hospital. States that she lost about 10 lbs in the last month.   Tolerating all foods ok. Feels good and has a lot of energy.   Surgery date: 10/22/12 Surgery type: RYGB Start weight at [facility]: 248.0 lbs (07/27/12)   Weight today: 121.5 lbs  Weight change: 26.5 lbs Total weight lost: 126.5 lbs   TANITA  BODY COMP RESULTS  07/27/12 12/17/12 01/15/13 04/15/13 08/18/13 01/16/14   BMI (kg/m^2) 42.6 36.7 34.9 31.4 25.4 20.9   Fat Mass (lbs) 130.5 101.0 96.0 75.5 52.0 19.0   Fat Free Mass (lbs) 117.5 113.0 107.5 107.5 96.0 102.5   Total Body Water (lbs) 86.0 82.5 78.5 78.5 70.5 75.0   24-hr recall:   B: Protein shake EAS (22 g) L: 2 oz meat and vegetables and potatoes (14 g) D: 3 oz roast, chicken, pork, beef or taco salad with cheese, lettuce (21g) S: sometimes a protein shake or string cheese (6-22g)  Fluid intake: 50 oz water, 11 oz protein shake (21-22g pro) = 60 oz Estimated total protein intake: 60-80g  Medications:  MD added Miralax daily Supplementation: only taking first 2 doses  CBG monitoring: every couple of weeks Average FBG per patient: ~119 mg/dl Last patient reported A1c:  Pt does not recall at time of visit. Scheduled for one next month.  Using straws: No Drinking while eating: No Hair loss:  Yes Carbonated beverages:  12 oz flat diet Pepsi daily  N/V/D/C:  Nausea or vomiting if eats too much, takes Miralax to prevent constipation Dumping syndrome: None  Recent physical activity:  Walks 4-5 days a week @ 25 min - very painful for her d/t  fibromyalgia pain  Progress Towards Goal(s):  In progress.   Nutritional Diagnosis:  [place]-3.3 Overweight/obesity related to past poor dietary habits and physical inactivity as evidenced by patient w/ recent RYGB surgery following dietary guidelines for continued weight loss.  [place]-3.2 Unintentional weight loss As related to hx of RYGB and recent stress.  As evidenced by body fat percentage of 15.6%.  Intervention:  Nutrition education/reinforcement. [name] agreed to add more carbohydrates and fat into her diet in order to gain about 10 lbs and restore some body fat stores.   Monitoring/Evaluation:  Dietary intake, exercise, and body weight. Follow up in 6 weeks for 16 month post-op visit.

## 2014-02-16 ENCOUNTER — Ambulatory Visit: Payer: BC Managed Care – PPO | Admitting: Dietician

## 2014-03-02 ENCOUNTER — Encounter: Payer: BC Managed Care – PPO | Attending: Internal Medicine | Admitting: Dietician

## 2014-03-02 VITALS — Ht 64.0 in | Wt 126.5 lb

## 2014-03-02 DIAGNOSIS — E669 Obesity, unspecified: Secondary | ICD-10-CM

## 2014-03-02 DIAGNOSIS — Z9884 Bariatric surgery status: Secondary | ICD-10-CM | POA: Insufficient documentation

## 2014-03-02 DIAGNOSIS — Z713 Dietary counseling and surveillance: Secondary | ICD-10-CM | POA: Insufficient documentation

## 2014-03-02 DIAGNOSIS — Z09 Encounter for follow-up examination after completed treatment for conditions other than malignant neoplasm: Secondary | ICD-10-CM | POA: Insufficient documentation

## 2014-03-02 NOTE — Progress Notes (Signed)
  Follow-up visit:  16 Months Post-Operative RYGB Surgery  Medical Nutrition Therapy:  Appt start time: 930   End time: 1000.  Primary concerns today: Post-operative Bariatric Surgery Nutrition Management. Toni Palmer returns for f/u with a 5 lbs weight gain. Stated that she lost more before gained weight again. Her stress level is still really high since her granddaughter is still in the hospital waiting for a transplant and he is caring for another granddaughter.    Tolerating all foods ok. Feels good and has a lot of energy. Her one complaint is that she gets gas.  Face looks fuller though we discussed her gaining about 10 more pounds.   Surgery date: 10/22/12 Surgery type: RYGB Start weight at [facility]: 248.0 lbs (07/27/12)   Weight today: 126.5  Weight change: +5 lbs  Total weight lost: 121.5 lbs   TANITA  BODY COMP RESULTS  07/27/12 12/17/12 01/15/13 04/15/13 08/18/13 01/16/14 03/02/14   BMI (kg/m^2) 42.6 36.7 34.9 31.4 25.4 20.9 21.7   Fat Mass (lbs) 130.5 101.0 96.0 75.5 52.0 19.0 29.5   Fat Free Mass (lbs) 117.5 113.0 107.5 107.5 96.0 102.5 97.0   Total Body Water (lbs) 86.0 82.5 78.5 78.5 70.5 75.0 71.0   24-hr recall:   B: Protein shake EAS (22 g) S: crackers and 1 oz cheese (6 g) L: 2 oz meat and vegetables and potatoes (14 g) S: crackers and 1 oz cheese (6 g) D: 3 oz roast, chicken, pork, beef or taco salad with cheese, lettuce (21g) S: none  Fluid intake: 50 oz water, 11 oz protein shake (21-22g pro) = 60 oz Estimated total protein intake: 79 g  Medications:  MD added Miralax daily Supplementation: "not doing as good as she should"  CBG monitoring: every couple of weeks Average FBG per patient: ~92mg /dl Last patient reported A1c:  Pt does not recall at time of visit.   Using straws: No Drinking while eating: No Hair loss:  Yes Carbonated beverages:  No N/V/D/C:  Nausea or vomiting if eats too much, takes Miralax to prevent constipation Dumping syndrome: None  Recent  physical activity:  Walks 4-5 days a week @ 25 min - very painful for her d/t fibromyalgia pain  Progress Towards Goal(s):  Some progress.   Nutritional Diagnosis:  [place]-3.3 Overweight/obesity related to past poor dietary habits and physical inactivity as evidenced by patient w/ recent RYGB surgery following dietary guidelines for continued weight loss.  [place]-3.2 Unintentional weight loss As related to hx of RYGB and recent stress.  As evidenced by body fat percentage of 15.6%.  Intervention:  Nutrition education/reinforcement. Khalani agreed to add more carbohydrates and fat into her diet in order to gain about 10 lbs and restore some body fat stores.   Monitoring/Evaluation:  Dietary intake, exercise, and body weight. Follow up in 3 months  for 19 month post-op visit.

## 2014-03-02 NOTE — Patient Instructions (Addendum)
Goals:  Continue to follow Phase 3B: High Protein + Non-Starchy Vegetables  Continue having lean protein foods to meet 60-80g goal  Continue having fluid intake to 64oz +  Add snacks in the morning and afternoon - combine carbohydrates with protein (add cashews)  Add 2 tablespoons of any salad dressing to salad  Add peanut butter, nuts, and cheese for snacks  Continue biotin to help with hair loss  Resume multivitamins, calcium, and B12 supplementation  Goal is to gain about 10 more lbs

## 2014-04-09 ENCOUNTER — Ambulatory Visit (INDEPENDENT_AMBULATORY_CARE_PROVIDER_SITE_OTHER): Payer: BC Managed Care – PPO | Admitting: Internal Medicine

## 2014-04-09 ENCOUNTER — Encounter: Payer: Self-pay | Admitting: Internal Medicine

## 2014-04-09 VITALS — BP 122/70 | HR 70 | Ht 64.0 in | Wt 121.6 lb

## 2014-04-09 DIAGNOSIS — J3089 Other allergic rhinitis: Secondary | ICD-10-CM

## 2014-04-09 DIAGNOSIS — J309 Allergic rhinitis, unspecified: Secondary | ICD-10-CM

## 2014-04-09 DIAGNOSIS — G4733 Obstructive sleep apnea (adult) (pediatric): Secondary | ICD-10-CM

## 2014-04-09 DIAGNOSIS — E663 Overweight: Secondary | ICD-10-CM

## 2014-04-09 DIAGNOSIS — J302 Other seasonal allergic rhinitis: Secondary | ICD-10-CM

## 2014-04-09 MED ORDER — ALBUTEROL SULFATE HFA 108 (90 BASE) MCG/ACT IN AERS: 2.0000 | INHALATION_SPRAY | Freq: Four times a day (QID) | RESPIRATORY_TRACT | Status: DC | PRN

## 2014-04-09 MED ORDER — FLUTICASONE PROPIONATE 50 MCG/ACT NA SUSP: 2.0000 | Freq: Every day | NASAL | Status: DC

## 2014-04-09 MED ORDER — FLUTICASONE-SALMETEROL 250-50 MCG/DOSE IN AEPB: 1.0000 | INHALATION_SPRAY | Freq: Two times a day (BID) | RESPIRATORY_TRACT | Status: DC

## 2014-04-09 NOTE — Progress Notes (Signed)
Subjective:    Patient ID: Toni Palmer, female    DOB: [birth year], 58 y.o.   MRN: [id number] HPI 48 WF with known hx of Asthma, AR, and OSA  05/11/11 Acute OV - NP visit Pt presents for an acute work in visit. Complains of prod cough with yellow/green mucus, increased SOB, wheezing, sinus pressue/congestion, chills/sweats x1.5weeks . Has teeth pain with bending forward. Lots of sinus congestion and drainage. She was previous on  on advair , flonase  Stopped several months ago. Would like to restart. Needs new rx. Congestion is getting worse.  Pt informs me she is currently undergoing preliminary workup for Lap Band surgery.   06/15/11- 69 yo F Followed for asthma, allergic rhinitis, asthma, OSA, complicated by DM, GERD, obesity Feeling better after NP acute visit last month. Now well except dull pain behind angle of jaw on right and mild hoarseness. She feels better having restarted Advair and flonase. Anticipating lap band surgery. Prior PFT She is using CPAP all night every night- no problem 12 cwp/ [facility]. Nasal pillows mask.  Uses rescue inhaler before walking or water aerobics.   04/09/13-  5 yo F Followed for asthma, allergic rhinitis, asthma, OSA, complicated by DM, GERD, obesity FOLLOWS FOR: wears CPAP 12/ [facility]HP every night for about 6-7 hours; follow up since gastric bypass. Weight down 66 pounds since gastric bypass in December. Still feels she cannot sleep without CPAP. Family tells her she is not snoring. Continues Adderall ER for fibromyalgia from Dr. [facility]. Walking limited by back and knee pain. Wakes with dry mouth. Some nasal congestion. Not using her bronchodilator inhalers.  ROS-see HPI Constitutional:   No-   weight loss, night sweats, fevers, chills, fatigue, lassitude. HEENT:   No-  headaches, difficulty swallowing, tooth/dental problems, sore throat,       No-  sneezing, itching, ear ache, nasal congestion, post nasal drip,  CV:  No-   chest pain,  orthopnea, PND, swelling in lower extremities, anasarca,                                  dizziness, palpitations Resp: No-   shortness of breath with exertion or at rest.              No-   productive cough,  No non-productive cough,  No- coughing up of blood.              No-   change in color of mucus.  No- wheezing.   Skin: No-   rash or lesions. GI:  No-   heartburn, indigestion, abdominal pain, nausea, vomiting,  GU:  MS:  + joint pain or swelling.   Neuro-     nothing unusual Psych:  No- change in mood or affect. No depression or anxiety.  No memory loss.   Objective:  General- Alert, Oriented, Affect-appropriate, Distress- none acute , +slender, pale Skin- rash-none, lesions- none, excoriation- none Lymphadenopathy- none Head- atraumatic            Eyes- Gross vision intact, PERRLA, conjunctivae clear secretions            Ears- Hearing, canals normal            Nose- Clear, No-Septal dev, mucus, polyps, erosion, perforation             Throat- Mallampati III posteriorly placed, mucosa clear , drainage- none, tonsils- atrophic Neck- flexible ,  trachea midline, no stridor , thyroid nl, carotid no bruit Chest - symmetrical excursion , unlabored           Heart/CV- RRR , no murmur , no gallop  , no rub, nl s1 s2                           - JVD- none , edema- none, stasis changes- none, varices- none           Lung- clear to P&A, wheeze- none, cough- none , dullness-none, rub- none           Chest wall-  Abd-  Br/ Gen/ Rectal- Not done, not indicated Extrem- cyanosis- none, clubbing, none, atrophy- none, strength- nl Neuro- grossly intact to observation  Assessment & Plan:

## 2014-04-09 NOTE — Patient Instructions (Signed)
Order- [place] Patient   autotitrate CPAP 4-15 x 7 days for pressure recommendation   Dx OSA  Scripts refilling Advair, flonase and albuterol rescue inhaler

## 2014-05-20 ENCOUNTER — Encounter: Payer: BC Managed Care – PPO | Attending: Internal Medicine | Admitting: Dietician

## 2014-05-20 VITALS — Ht 64.0 in | Wt 118.0 lb

## 2014-05-20 DIAGNOSIS — Z713 Dietary counseling and surveillance: Secondary | ICD-10-CM | POA: Insufficient documentation

## 2014-05-20 DIAGNOSIS — Z09 Encounter for follow-up examination after completed treatment for conditions other than malignant neoplasm: Secondary | ICD-10-CM | POA: Insufficient documentation

## 2014-05-20 DIAGNOSIS — Z9884 Bariatric surgery status: Secondary | ICD-10-CM | POA: Insufficient documentation

## 2014-05-20 DIAGNOSIS — E669 Obesity, unspecified: Secondary | ICD-10-CM | POA: Insufficient documentation

## 2014-05-20 NOTE — Progress Notes (Signed)
  Follow-up visit:  18 Months Post-Operative RYGB Surgery  Medical Nutrition Therapy:  Appt start time: 230   End time: 300.  Primary concerns today: Post-operative Bariatric Surgery Nutrition Management. Toni Palmer returns for f/u with a 8 lbs weight loss. Has been trying to eat every 2 hours. Throws up about 1-2 x per week if she eats too fast (usually just mucous). Reports no change in appetite thought is eating more. Not sure why she is losing weight. Body fat is at 17.7%.   Still having a lot of gas.   Surgery date: 10/22/12 Surgery type: RYGB Start weight at [facility]: 248.0 lbs (07/27/12)   Weight today: 118.0 lbs  Weight change:  Lost 6 lbs  Total weight lost: 121.5 lbs   TANITA  BODY COMP RESULTS  07/27/12 12/17/12 01/15/13 04/15/13 08/18/13 01/16/14 03/02/14 05/20/14   BMI (kg/m^2) 42.6 36.7 34.9 31.4 25.4 20.9 21.7 20.3   Fat Mass (lbs) 130.5 101.0 96.0 75.5 52.0 19.0 29.5 21.0   Fat Free Mass (lbs) 117.5 113.0 107.5 107.5 96.0 102.5 97.0 97.0   Total Body Water (lbs) 86.0 82.5 78.5 78.5 70.5 75.0 71.0 71.0   24-hr recall:   B: Protein shake EAS (42 g) S: crackers and 1 oz cheese (6 g) L: 2 oz meat and vegetables and potatoes (14 g) S: crackers and 1 oz cheese (6 g) D: 3 oz roast, chicken, pork, beef or taco salad with cheese, lettuce (21g) S: none  Fluid intake: 50 oz water, 11 oz protein shake (42g pro) = 60 oz Estimated total protein intake: 60-80 g  Medications:  MD added Miralax daily Supplementation: taking, added probiotic   CBG monitoring: every couple of weeks Average FBG per patient: ~109mg /dl Last patient reported A1c:  Pt does not recall at time of visit.   Using straws: No Drinking while eating: No Hair loss:  Yes Carbonated beverages:  No N/V/D/C:  Nausea or vomiting if eats too much, takes Miralax to prevent constipation Dumping syndrome: None  Recent physical activity:  Walks 3-4 days a week @ 25 min - very painful for her d/t fibromyalgia pain  Progress  Towards Goal(s):  Some progress.   Nutritional Diagnosis:  [place]-3.3 Overweight/obesity related to past poor dietary habits and physical inactivity as evidenced by patient w/ recent RYGB surgery following dietary guidelines for continued weight loss.  -3.2 Unintentional weight loss As related to hx of RYGB and recent stress.  As evidenced by body fat percentage of 15.6%.  Intervention:  Nutrition education/reinforcement. [name] agreed to add more carbohydrates and fat into her diet in order to gain about 10 lbs and restore some body fat stores.   Monitoring/Evaluation:  Dietary intake, exercise, and body weight. Follow up in 3 months  for 22 month post-op visit.

## 2014-05-20 NOTE — Patient Instructions (Addendum)
Goals:  Continue having lean protein foods to meet 60-80g goal  Continue having fluid intake to 64oz +  Add [employer] Breakfast to whole milk 1-2 x day (check blood sugar 2-3 hours after)  Add a protein bar [facility] or Atkins)  Add cheese, oil, mayonnaise, nuts to foods  Add bread if tolerated and if protein needs are met

## 2014-05-31 NOTE — Assessment & Plan Note (Signed)
Significant weight loss after bariatric surgery. May not want to lose any more than this.

## 2014-05-31 NOTE — Assessment & Plan Note (Signed)
Plan-discussed seasonal symptom choices. Flonase

## 2014-05-31 NOTE — Assessment & Plan Note (Signed)
Needs reassessment after a significant weight loss. Plan-CPAP autotitration range 5-15

## 2014-06-01 ENCOUNTER — Ambulatory Visit (INDEPENDENT_AMBULATORY_CARE_PROVIDER_SITE_OTHER): Payer: BC Managed Care – PPO | Admitting: Neurology

## 2014-06-01 ENCOUNTER — Ambulatory Visit: Payer: BC Managed Care – PPO | Admitting: Dietician

## 2014-06-01 ENCOUNTER — Encounter: Payer: Self-pay | Admitting: Neurology

## 2014-06-01 ENCOUNTER — Telehealth: Payer: Self-pay | Admitting: Neurology

## 2014-06-01 VITALS — BP 120/66 | HR 98 | Ht 63.5 in | Wt 117.0 lb

## 2014-06-01 DIAGNOSIS — G5139 Clonic hemifacial spasm, unspecified: Secondary | ICD-10-CM

## 2014-06-01 DIAGNOSIS — G518 Other disorders of facial nerve: Secondary | ICD-10-CM

## 2014-06-01 DIAGNOSIS — F32A Depression, unspecified: Secondary | ICD-10-CM

## 2014-06-01 DIAGNOSIS — G2401 Drug induced subacute dyskinesia: Secondary | ICD-10-CM

## 2014-06-01 DIAGNOSIS — F3289 Other specified depressive episodes: Secondary | ICD-10-CM

## 2014-06-01 DIAGNOSIS — R413 Other amnesia: Secondary | ICD-10-CM

## 2014-06-01 DIAGNOSIS — F329 Major depressive disorder, single episode, unspecified: Secondary | ICD-10-CM

## 2014-06-01 MED ORDER — AMANTADINE HCL 100 MG PO TABS: 100.0000 mg | ORAL_TABLET | Freq: Three times a day (TID) | ORAL | Status: DC

## 2014-06-01 NOTE — Patient Instructions (Signed)
1. Please increase Amantadine to 1 tablet three times daily. A new prescription has been sent to your pharmacy. 2. We will work on approval for Botox and contact you once this has been approved.
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
## 2014-06-01 NOTE — Telephone Encounter (Signed)
Left message on machine for patient to call back.  To make her aware I do need to set up an MR brain with and without contrast for right hemifacial spasm.

## 2014-06-01 NOTE — Progress Notes (Signed)
Note faxed to Dr Toy Care at [phone].

## 2014-06-02 NOTE — Telephone Encounter (Signed)
MR scheduled for 06/22/2014 at 12:00 pm. Patient made aware.

## 2014-06-16 ENCOUNTER — Ambulatory Visit: Payer: BC Managed Care – PPO | Admitting: Dietician

## 2014-06-22 ENCOUNTER — Ambulatory Visit (HOSPITAL_COMMUNITY): Payer: BC Managed Care – PPO

## 2014-06-22 ENCOUNTER — Encounter: Payer: Self-pay | Admitting: Gastroenterology

## 2014-07-07 ENCOUNTER — Telehealth: Payer: Self-pay | Admitting: Neurology

## 2014-07-07 NOTE — Telephone Encounter (Signed)
Left message on machine for patient to call back. To make her aware Botox approved and to schedule Botox appt. Awaiting call back.

## 2014-08-13 ENCOUNTER — Ambulatory Visit (AMBULATORY_SURGERY_CENTER): Payer: Self-pay | Admitting: *Deleted

## 2014-08-13 VITALS — Ht 63.0 in | Wt 113.8 lb

## 2014-08-13 DIAGNOSIS — Z86010 Personal history of colonic polyps: Secondary | ICD-10-CM

## 2014-08-13 MED ORDER — NA SULFATE-K SULFATE-MG SULF 17.5-3.13-1.6 GM/177ML PO SOLN: 1.0000 | Freq: Once | ORAL | Status: DC

## 2014-08-13 NOTE — Progress Notes (Signed)
No home 02 use. ewm No egg or soy allergy. ewm No problems with past sedation. ewm No diet pills. ewm Previous colon with kaplan in 2009. ewm emmi video to pt's e mail. ewm Pt had gastric by pass surgery and is worried about the volume of the prep.  Instructed to start her prep about an hour before she is supposed to to give her extra time to get it in. Call with questions. ewm

## 2014-08-21 ENCOUNTER — Ambulatory Visit (INDEPENDENT_AMBULATORY_CARE_PROVIDER_SITE_OTHER): Payer: BC Managed Care – PPO | Admitting: Family Medicine

## 2014-08-21 VITALS — BP 144/74 | HR 82 | Temp 98.3°F | Resp 16 | Ht 63.25 in | Wt 113.0 lb

## 2014-08-21 DIAGNOSIS — M5489 Other dorsalgia: Secondary | ICD-10-CM

## 2014-08-21 DIAGNOSIS — B958 Unspecified staphylococcus as the cause of diseases classified elsewhere: Secondary | ICD-10-CM

## 2014-08-21 DIAGNOSIS — Z23 Encounter for immunization: Secondary | ICD-10-CM

## 2014-08-21 DIAGNOSIS — L089 Local infection of the skin and subcutaneous tissue, unspecified: Secondary | ICD-10-CM

## 2014-08-21 HISTORY — DX: Unspecified staphylococcus as the cause of diseases classified elsewhere: B95.8

## 2014-08-21 HISTORY — DX: Local infection of the skin and subcutaneous tissue, unspecified: L08.9

## 2014-08-21 MED ORDER — LIDOCAINE 5 % EX PTCH: 1.0000 | MEDICATED_PATCH | CUTANEOUS | Status: DC

## 2014-08-21 MED ORDER — CEPHALEXIN 500 MG PO CAPS: 500.0000 mg | ORAL_CAPSULE | Freq: Three times a day (TID) | ORAL | Status: DC

## 2014-08-21 NOTE — Progress Notes (Signed)
   Subjective:    Patient ID: Toni Palmer, female    DOB: 24-Oct-1956, 58 y.o.   MRN: [id number]  HPI Patient presents today with a blistery rash. She has had 4-5 lesions on her arms and 1 on her left upper abdomen. The bumps start with small white head and then increase to blister which opens and drains white liquid then becomes scaly with palpable residual core.  Has not traveled recently. No change of soap, lotion, detergent. She has not been gardening. No insect bites. No one else in family has similar rash.  She regularly sees Dr. Shelia Media, but was unable to get an appointment with him today.   She also has chronic, infrequent low back pain and would like a lidocaine patch prescribed. She used these occasionally in the past with good localized pain relief.  Review of Systems No fever, no cough, some sinus drainage last week. Feels fine. No current or recent back pain. No weakness, no falls.     Objective:   Physical Exam  Vitals reviewed. Constitutional: She is oriented to person, place, and time. She appears well-developed and well-nourished.  HENT:  Head: Normocephalic and atraumatic.  Eyes: Conjunctivae are normal.  Neck: Normal range of motion. Neck supple.  Cardiovascular: Normal rate.   Pulmonary/Chest: Effort normal.  Musculoskeletal: Normal range of motion.  Neurological: She is alert and oriented to person, place, and time.  Skin: Skin is warm and dry. Rash noted.  Left 3rd finger, dorsum, with yellow bullous (cleaned with alcohol and 18 gauge needle used to release fluid for culture). Right forearm, right wrist and upper right abdomen with 1.5 cm erythematous, papules (approximatly 5 total). Left forearm with healing area, 1 cm, scaly. Non erythematous.   Psychiatric: She has a normal mood and affect. Her behavior is normal. Judgment and thought content normal.       Assessment & Plan:  1. Skin infection/rash -not sure exactly what has caused this, but will cover  with antibiotic due to purulent drainage. - cephALEXin (KEFLEX) 500 MG capsule; Take 1 capsule (500 mg total) by mouth 3 (three) times daily.  Dispense: 21 capsule; Refill: 0 - Wound culture  2. Other back pain - lidocaine (LIDODERM) 5 %; Place 1 patch onto the skin daily. Remove & Discard patch within 12 hours or as directed by MD  Dispense: 30 patch; Refill: 0   Elby Beck, FNP-BC  [facility]  08/21/2014 4:18 PM

## 2014-08-24 LAB — WOUND CULTURE
Gram Stain: NONE SEEN
Gram Stain: NONE SEEN
Gram Stain: NONE SEEN

## 2014-08-25 ENCOUNTER — Telehealth: Payer: Self-pay | Admitting: Internal Medicine

## 2014-08-25 DIAGNOSIS — G4733 Obstructive sleep apnea (adult) (pediatric): Secondary | ICD-10-CM

## 2014-08-25 NOTE — Telephone Encounter (Signed)
Called [facility]. They are now closed. Will call back in AM

## 2014-08-26 NOTE — Telephone Encounter (Signed)
Called and spoke to pt. Advised pt of the recs per CY. Pt verbalized understanding and is aware that someone will be contacting her to change the humidification. Order placed for download. Called and spoke to [facility] and made her aware of situation. Nothing further needed.

## 2014-08-26 NOTE — Telephone Encounter (Signed)
Called and spoke to [facility] with [place] [facility] and she stated the pt has refused a replacement CPAP d/t the monthly cost and upfront pay for the new machine. Pt was to be upgraded to the s10 resmed CPAP. Called and spoke to pt. Pt stated she still has her old CPAP and it is working fine but she stopped wearing it because it was causing her dry mouth. Pt wearing mirage swift 2 mask. Pt stated she felt as the air wasn't going down her throat but going in her mouth. Pt has had her old CPAP since 2008. Advised pt that I can send message to CY to see if he can offer any recs so she can continue using a CPAP. Called and made Merla aware of situation.   CY please advise.

## 2014-08-26 NOTE — Telephone Encounter (Signed)
Ask patient and DME to work together on adjustment of humidifier for her old machine, also ask her to start wearing again and ask DME to get a download for pressure assessment Dx OSA

## 2014-08-28 ENCOUNTER — Encounter: Payer: Self-pay | Admitting: Gastroenterology

## 2014-08-28 ENCOUNTER — Ambulatory Visit (AMBULATORY_SURGERY_CENTER): Payer: BC Managed Care – PPO | Admitting: Gastroenterology

## 2014-08-28 VITALS — BP 140/82 | HR 56 | Temp 96.7°F | Resp 17 | Ht 63.0 in | Wt 113.0 lb

## 2014-08-28 DIAGNOSIS — D123 Benign neoplasm of transverse colon: Secondary | ICD-10-CM

## 2014-08-28 DIAGNOSIS — D124 Benign neoplasm of descending colon: Secondary | ICD-10-CM

## 2014-08-28 DIAGNOSIS — D125 Benign neoplasm of sigmoid colon: Secondary | ICD-10-CM

## 2014-08-28 DIAGNOSIS — Z86010 Personal history of colonic polyps: Secondary | ICD-10-CM

## 2014-08-28 DIAGNOSIS — D120 Benign neoplasm of cecum: Secondary | ICD-10-CM

## 2014-08-28 DIAGNOSIS — K5730 Diverticulosis of large intestine without perforation or abscess without bleeding: Secondary | ICD-10-CM

## 2014-08-28 DIAGNOSIS — K635 Polyp of colon: Secondary | ICD-10-CM

## 2014-08-28 MED ORDER — SODIUM CHLORIDE 0.9 % IV SOLN: 500.0000 mL | INTRAVENOUS | Status: DC

## 2014-08-28 NOTE — Op Note (Signed)
[place]  [address]. [place], [id number]   COLONOSCOPY PROCEDURE REPORT  PATIENT: Toni Palmer, Toni Palmer  MR#: [id number] BIRTHDATE: November 24, 1955 , 58  yrs. old GENDER: female ENDOSCOPIST: Inda Castle, MD REFERRED AO:[id number] Delight Hoh, M.D. PROCEDURE DATE:  08/28/2014 PROCEDURE:   Colonoscopy with snare polypectomy and Colonoscopy with cold biopsy polypectomy First Screening Colonoscopy - Avg.  risk and is 50 yrs.  old or older - No.  Prior Negative Screening - Now for repeat screening. N/A  History of Adenoma - Now for follow-up colonoscopy & has been > or = to 3 yrs.  Yes hx of adenoma.  Has been 3 or more years since last colonoscopy.  Polyps Removed Today? Yes. ASA CLASS:   Class II INDICATIONS:high risk personal history of colonic polyps.  2009 MEDICATIONS: Monitored anesthesia care and Propofol 260 mg IV  DESCRIPTION OF PROCEDURE:   After the risks benefits and alternatives of the procedure were thoroughly explained, informed consent was obtained.  The digital rectal exam revealed no abnormalities of the rectum.   The [id number]  endoscope was introduced through the anus and advanced to the cecum, which was identified by both the appendix and ileocecal valve. No adverse events experienced.   The quality of the prep was Suprep adequate The instrument was then slowly withdrawn as the colon was fully examined.      COLON FINDINGS: A semi-pedunculated polyp measuring 15 mm in size was found in the sigmoid colon.  A polypectomy was performed using snare cautery.  The resection was complete, the polyp tissue was completely retrieved and sent to histology.   There was moderate diverticulosis noted in the descending colon and sigmoid colon.   A sessile polyp measuring 2 mm in size was found at the cecum.  A polypectomy was performed with cold forceps.   A sessile polyp measuring 4 mm in size was found at the hepatic flexure.  A polypectomy was  performed with a cold snare.  The resection was complete, the polyp tissue was completely retrieved and sent to histology.   Two sessile polyps measuring 5 mm in size were found in the transverse colon.  A polypectomy was performed with a cold snare.  The resection was complete, the polyp tissue was completely retrieved and sent to histology.   A sessile polyp measuring 3 mm in size was found in the descending colon.  A polypectomy was performed with a cold snare.  The resection was complete, the polyp tissue was completely retrieved and sent to histology.  Retroflexed views revealed no abnormalities. The time to cecum=5 minutes 25 seconds.  Withdrawal time=16 minutes 36 seconds.  The scope was withdrawn and the procedure completed. COMPLICATIONS: There were no immediate complications.  ENDOSCOPIC IMPRESSION: 1.   Semi-pedunculated polyp measuring 15 mm in size was found in the sigmoid colon; polypectomy was performed using snare cautery 2.   Moderate diverticulosis was noted in the descending colon and sigmoid colon 3.   Sessile polyp measuring 2 mm in size was found at the cecum; polypectomy was performed with cold forceps 4.   Sessile polyp measuring 4 mm in size was found at the hepatic flexure; polypectomy was performed with a cold snare 5.   Two sessile polyps measuring 5 mm in size were found in the transverse colon; polypectomy was performed with a cold snare 6.   Sessile polyp measuring 3 mm in size was found in the descending colon; polypectomy was performed with a  cold snare  RECOMMENDATIONS: If the polyp(s) removed today are proven to be adenomatous (pre-cancerous) polyps, you will need a colonoscopy in 3 years. Otherwise you should continue to follow colorectal cancer screening guidelines for "routine risk" patients with a colonoscopy in 10 years.  You will receive a letter within 1-2 weeks with the results of your biopsy as well as final recommendations.  Please call  my office if you have not received a letter after 3 weeks.  eSigned:  Inda Castle, MD 08/28/2014 11:54 AM   cc:   PATIENT NAME:  Toni Palmer, Toni Palmer MR#: [id number]

## 2014-08-28 NOTE — Progress Notes (Signed)
Procedure ends, to recovery, report given and VSS. 

## 2014-08-28 NOTE — Progress Notes (Signed)
Called to room to assist during endoscopic procedure.  Patient ID and intended procedure confirmed with present staff. Received instructions for my participation in the procedure from the performing physician.  

## 2014-08-28 NOTE — Patient Instructions (Signed)
YOU HAD AN ENDOSCOPIC PROCEDURE TODAY AT THE [facility]: Refer to the procedure report that was given to you for any specific questions about what was found during the examination.  If the procedure report does not answer your questions, please call your gastroenterologist to clarify.  If you requested that your care partner not be given the details of your procedure findings, then the procedure report has been included in a sealed envelope for you to review at your convenience later.  YOU SHOULD EXPECT: Some feelings of bloating in the abdomen. Passage of more gas than usual.  Walking can help get rid of the air that was put into your GI tract during the procedure and reduce the bloating. If you had a lower endoscopy (such as a colonoscopy or flexible sigmoidoscopy) you may notice spotting of blood in your stool or on the toilet paper. If you underwent a bowel prep for your procedure, then you may not have a normal bowel movement for a few days.  DIET: Your first meal following the procedure should be a light meal and then it is ok to progress to your normal diet.  A half-sandwich or bowl of soup is an example of a good first meal.  Heavy or fried foods are harder to digest and may make you feel nauseous or bloated.  Likewise meals heavy in dairy and vegetables can cause extra gas to form and this can also increase the bloating.  Drink plenty of fluids but you should avoid alcoholic beverages for 24 hours.  ACTIVITY: Your care partner should take you home directly after the procedure.  You should plan to take it easy, moving slowly for the rest of the day.  You can resume normal activity the day after the procedure however you should NOT DRIVE or use heavy machinery for 24 hours (because of the sedation medicines used during the test).    SYMPTOMS TO REPORT IMMEDIATELY: A gastroenterologist can be reached at any hour.  During normal business hours, 8:30 AM to 5:00 PM Monday through Friday,  call [phone].  After hours and on weekends, please call the GI answering service at [phone] who will take a message and have the physician on call contact you.   Following lower endoscopy (colonoscopy or flexible sigmoidoscopy):  Excessive amounts of blood in the stool  Significant tenderness or worsening of abdominal pains  Swelling of the abdomen that is new, acute  Fever of 100F or higher    FOLLOW UP: If any biopsies were taken you will be contacted by phone or by letter within the next 1-3 weeks.  Call your gastroenterologist if you have not heard about the biopsies in 3 weeks.  Our staff will call the home number listed on your records the next business day following your procedure to check on you and address any questions or concerns that you may have at that time regarding the information given to you following your procedure. This is a courtesy call and so if there is no answer at the home number and we have not heard from you through the emergency physician on call, we will assume that you have returned to your regular daily activities without incident.  SIGNATURES/CONFIDENTIALITY: You and/or your care partner have signed paperwork which will be entered into your electronic medical record.  These signatures attest to the fact that that the information above on your After Visit Summary has been reviewed and is understood.  Full responsibility of the confidentiality   of this discharge information lies with you and/or your care-partner.     

## 2014-08-31 ENCOUNTER — Telehealth: Payer: Self-pay

## 2014-08-31 NOTE — Telephone Encounter (Signed)
Left message on answering machine. 

## 2014-09-02 ENCOUNTER — Encounter: Payer: Self-pay | Admitting: Gastroenterology

## 2014-09-04 ENCOUNTER — Other Ambulatory Visit: Payer: Self-pay

## 2014-10-13 ENCOUNTER — Telehealth: Payer: Self-pay | Admitting: Internal Medicine

## 2014-10-13 ENCOUNTER — Ambulatory Visit: Payer: BC Managed Care – PPO | Admitting: Internal Medicine

## 2014-10-13 NOTE — Telephone Encounter (Signed)
Called and spoke to pt. Pt had appt with CY today at 9:15 that was cancelled. Pt questioned if she still needed to bring in her CPAP card for download. Advised pt that she will need to bring in the card with her next appt with CY. Pt verbalized understanding and denied any further questions or concerns at this time.

## 2014-12-07 ENCOUNTER — Ambulatory Visit: Payer: BC Managed Care – PPO | Admitting: Internal Medicine

## 2015-01-12 ENCOUNTER — Telehealth: Payer: Self-pay | Admitting: Gastroenterology

## 2015-01-12 NOTE — Telephone Encounter (Signed)
Spoke with Fraser Din. Patient has declined appointment before 01/14/15. Scheduled with paula. Records faxed.

## 2015-01-14 ENCOUNTER — Ambulatory Visit (INDEPENDENT_AMBULATORY_CARE_PROVIDER_SITE_OTHER): Payer: BC Managed Care – PPO | Admitting: Nurse Practitioner

## 2015-01-14 ENCOUNTER — Other Ambulatory Visit (INDEPENDENT_AMBULATORY_CARE_PROVIDER_SITE_OTHER): Payer: BC Managed Care – PPO

## 2015-01-14 ENCOUNTER — Encounter: Payer: Self-pay | Admitting: Nurse Practitioner

## 2015-01-14 VITALS — BP 112/64 | HR 80 | Ht 63.5 in | Wt 99.0 lb

## 2015-01-14 DIAGNOSIS — R195 Other fecal abnormalities: Secondary | ICD-10-CM

## 2015-01-14 DIAGNOSIS — R634 Abnormal weight loss: Secondary | ICD-10-CM

## 2015-01-14 DIAGNOSIS — K920 Hematemesis: Secondary | ICD-10-CM

## 2015-01-14 LAB — CBC WITH DIFFERENTIAL/PLATELET
Basophils Absolute: 0 10*3/uL (ref 0.0–0.1)
Basophils Relative: 0.3 % (ref 0.0–3.0)
Eosinophils Absolute: 0.1 10*3/uL (ref 0.0–0.7)
Eosinophils Relative: 0.9 % (ref 0.0–5.0)
HCT: 33.2 % — ABNORMAL LOW (ref 36.0–46.0)
Hemoglobin: 11.1 g/dL — ABNORMAL LOW (ref 12.0–15.0)
Lymphocytes Relative: 25.7 % (ref 12.0–46.0)
Lymphs Abs: 3 10*3/uL (ref 0.7–4.0)
MCHC: 33.3 g/dL (ref 30.0–36.0)
MCV: 88.9 fl (ref 78.0–100.0)
Monocytes Absolute: 0.4 10*3/uL (ref 0.1–1.0)
Monocytes Relative: 3.2 % (ref 3.0–12.0)
Neutro Abs: 8.1 10*3/uL — ABNORMAL HIGH (ref 1.4–7.7)
Neutrophils Relative %: 69.9 % (ref 43.0–77.0)
Platelets: 636 10*3/uL — ABNORMAL HIGH (ref 150.0–400.0)
RBC: 3.73 Mil/uL — ABNORMAL LOW (ref 3.87–5.11)
RDW: 13.7 % (ref 11.5–15.5)
WBC: 11.5 10*3/uL — ABNORMAL HIGH (ref 4.0–10.5)

## 2015-01-14 NOTE — Patient Instructions (Signed)
No Aleve or any other Antiinflammatories  You have been scheduled for an endoscopy. Please follow written instructions given to you at your visit today. If you use inhalers (even only as needed), please bring them with you on the day of your procedure. Your physician has requested that you go to www.startemmi.com and enter the access code given to you at your visit today. This web site gives a general overview about your procedure. However, you should still follow specific instructions given to you by our office regarding your preparation for the procedure.  Go to the basement for labs today

## 2015-01-15 NOTE — Progress Notes (Signed)
     History of Present Illness:  Patient is a 59 year old female known to Dr. Deatra Ina. She has a history of diabetic gastroparesis Patient is s/p laparoscopic Roux-en-y gastric bypass December 2013. She is referred by PCP for evaluation of anemia and hematemesis. Since gastric bypass patient has lost a significant amount of weight. She has had excessive weight loss over the last several months and is down to 99 pounds.   Patient describes chronic postprandial epigastric pain / vomiting when she overeats. This has been going on since her bypass. Several days ago she vomited brown liquid with dark flecks. She did take NSAIDS for a month of so prior to episode of brown emesis several days ago. She doesn't take a PPI. Other than the symptoms associated with overeating patient has no abdominal pain. Her bowels are unchanged. She does complain of fatigue.  Current Medications, Allergies, Past Medical History, Past Surgical History, Family History and Social History were reviewed in [employer] record.  Physical Exam: General: Pleasant, extremely thin white female in no acute distress Head: Normocephalic and atraumatic Eyes:  sclerae anicteric, conjunctiva pink  Ears: Normal auditory acuity Lungs: Clear throughout to auscultation Heart: Regular rate and rhythm Abdomen: Soft, non distended, non-tender. No masses, no hepatomegaly. Normal bowel sounds Rectal: brown stool with smears of blood  Musculoskeletal: Symmetrical with no gross deformities  Extremities: No edema  Neurological: Alert oriented x 4, grossly nonfocal Psychological:  Alert and cooperative. Normal mood and affect  Assessment and Recommendations:   59 year old female who is s/p roux-en-Y gastric bypass 2013. She has lost a significant amount of weight, especially over the last several months. She has had postprandial epigastric pain / vomiting associated with overeating since gastric bypass. She has a known  history of gastroparesis but given bariatric surgey Rule out gastroparesis, post-op strictures. Patient describes episode of hematemesis several days ago, she has blood in stool on exam today.    Patient needs an upper endoscopy. She has been scheduled for 02/01/15.   Will check CBC today, depending on results she may need it done more urgently. If hgb is stable will keep 02/01/15 EGD and possibly get an upper GI series in the interim.

## 2015-01-17 DIAGNOSIS — R195 Other fecal abnormalities: Secondary | ICD-10-CM | POA: Insufficient documentation

## 2015-01-17 DIAGNOSIS — R634 Abnormal weight loss: Secondary | ICD-10-CM | POA: Insufficient documentation

## 2015-01-18 NOTE — Progress Notes (Signed)
Reviewed and agree with management. Robert D. Kaplan, M.D., FACG  

## 2015-02-01 ENCOUNTER — Encounter: Payer: Self-pay | Admitting: Gastroenterology

## 2015-02-01 ENCOUNTER — Ambulatory Visit (AMBULATORY_SURGERY_CENTER): Payer: BC Managed Care – PPO | Admitting: Gastroenterology

## 2015-02-01 VITALS — BP 109/73 | HR 54 | Temp 96.2°F | Resp 20 | Ht 63.5 in | Wt 99.0 lb

## 2015-02-01 DIAGNOSIS — K2970 Gastritis, unspecified, without bleeding: Secondary | ICD-10-CM | POA: Diagnosis not present

## 2015-02-01 DIAGNOSIS — K257 Chronic gastric ulcer without hemorrhage or perforation: Secondary | ICD-10-CM

## 2015-02-01 DIAGNOSIS — R195 Other fecal abnormalities: Secondary | ICD-10-CM

## 2015-02-01 MED ORDER — OMEPRAZOLE 20 MG PO CPDR: 20.0000 mg | DELAYED_RELEASE_CAPSULE | Freq: Every day | ORAL | Status: DC

## 2015-02-01 MED ORDER — SODIUM CHLORIDE 0.9 % IV SOLN: 500.0000 mL | INTRAVENOUS | Status: DC

## 2015-02-01 NOTE — Patient Instructions (Signed)
YOU HAD AN ENDOSCOPIC PROCEDURE TODAY AT THE [facility]:   Refer to the procedure report that was given to you for any specific questions about what was found during the examination.  If the procedure report does not answer your questions, please call your gastroenterologist to clarify.  If you requested that your care partner not be given the details of your procedure findings, then the procedure report has been included in a sealed envelope for you to review at your convenience later.  YOU SHOULD EXPECT: Some feelings of bloating in the abdomen. Passage of more gas than usual.  Walking can help get rid of the air that was put into your GI tract during the procedure and reduce the bloating. If you had a lower endoscopy (such as a colonoscopy or flexible sigmoidoscopy) you may notice spotting of blood in your stool or on the toilet paper. If you underwent a bowel prep for your procedure, you may not have a normal bowel movement for a few days.  Please Note:  You might notice some irritation and congestion in your nose or some drainage.  This is from the oxygen used during your procedure.  There is no need for concern and it should clear up in a day or so.  SYMPTOMS TO REPORT IMMEDIATELY:     Following upper endoscopy (EGD)  Vomiting of blood or coffee ground material  New chest pain or pain under the shoulder blades  Painful or persistently difficult swallowing  New shortness of breath  Fever of 100F or higher  Black, tarry-looking stools  For urgent or emergent issues, a gastroenterologist can be reached at any hour by calling [phone].   DIET: Your first meal following the procedure should be a small meal and then it is ok to progress to your normal diet. Heavy or fried foods are harder to digest and may make you feel nauseous or bloated.  Likewise, meals heavy in dairy and vegetables can increase bloating.  Drink plenty of fluids but you should avoid alcoholic beverages  for 24 hours.  ACTIVITY:  You should plan to take it easy for the rest of today and you should NOT DRIVE or use heavy machinery until tomorrow (because of the sedation medicines used during the test).    FOLLOW UP: Our staff will call the number listed on your records the next business day following your procedure to check on you and address any questions or concerns that you may have regarding the information given to you following your procedure. If we do not reach you, we will leave a message.  However, if you are feeling well and you are not experiencing any problems, there is no need to return our call.  We will assume that you have returned to your regular daily activities without incident.  If any biopsies were taken you will be contacted by phone or by letter within the next 1-3 weeks.  Please call us at [phone] if you have not heard about the biopsies in 3 weeks.    SIGNATURES/CONFIDENTIALITY: You and/or your care partner have signed paperwork which will be entered into your electronic medical record.  These signatures attest to the fact that that the information above on your After Visit Summary has been reviewed and is understood.  Full responsibility of the confidentiality of this discharge information lies with you and/or your care-partner.  Resume medications.  

## 2015-02-01 NOTE — Op Note (Signed)
[place]  [address] [place], [address]   ENDOSCOPY PROCEDURE REPORT  PATIENT: Toni, Palmer  MR#: [id number] BIRTHDATE: 10/15/1956 , 58  yrs. old GENDER: female ENDOSCOPIST: Inda Castle, MD REFERRED BY:  Alden Server, M.D. PROCEDURE DATE:  02/01/2015 PROCEDURE:  EGD w/ biopsy ASA CLASS:     Class II INDICATIONS:  hematemesis. MEDICATIONS: Monitored anesthesia care and Propofol 160 mg IV TOPICAL ANESTHETIC:  DESCRIPTION OF PROCEDURE: After the risks benefits and alternatives of the procedure were thoroughly explained, informed consent was obtained.  The LB [id number] endoscope was introduced through the mouth and advanced to the proximal jejunum , Without limitations.  The instrument was slowly withdrawn as the mucosa was fully examined.    There was marked inflammation at the gastrojejunostomy site. Mucosa was edematous.  There were multiple areas of ulceration. Biopsies were taken.  The afferent and efferent loops were normal. Gastric pouch was  normal.        The scope was then withdrawn from the patient and the procedure completed.  COMPLICATIONS: There were no immediate complications.  ENDOSCOPIC IMPRESSION: marginal ulcers  RECOMMENDATIONS: Await pathology results begin omeprazole 20 mg daily  REPEAT EXAM:  eSigned:  Inda Castle, MD 02/01/2015 11:13 AM    CC:

## 2015-02-01 NOTE — Progress Notes (Signed)
Called to room to assist during endoscopic procedure.  Patient ID and intended procedure confirmed with present staff. Received instructions for my participation in the procedure from the performing physician.  

## 2015-02-01 NOTE — Progress Notes (Signed)
To recovery, report to [facility], Therapist, sports. VSS

## 2015-02-02 ENCOUNTER — Telehealth: Payer: Self-pay

## 2015-02-02 NOTE — Telephone Encounter (Signed)
Left a message at [phone] for the pt to call us back if any questions or concerns. maw

## 2015-02-04 ENCOUNTER — Encounter: Payer: Self-pay | Admitting: Gastroenterology

## 2015-03-24 ENCOUNTER — Ambulatory Visit (INDEPENDENT_AMBULATORY_CARE_PROVIDER_SITE_OTHER): Payer: BC Managed Care – PPO | Admitting: Gastroenterology

## 2015-03-24 ENCOUNTER — Encounter: Payer: Self-pay | Admitting: Gastroenterology

## 2015-03-24 VITALS — BP 114/62 | HR 80 | Ht 62.75 in | Wt 104.0 lb

## 2015-03-24 DIAGNOSIS — K259 Gastric ulcer, unspecified as acute or chronic, without hemorrhage or perforation: Secondary | ICD-10-CM | POA: Insufficient documentation

## 2015-03-24 DIAGNOSIS — E441 Mild protein-calorie malnutrition: Secondary | ICD-10-CM | POA: Diagnosis not present

## 2015-03-24 NOTE — Progress Notes (Signed)
      History of Present Illness:  Toni Palmer is here following upper endoscopy where marginal ulcers were seen.  She had presented with postprandial abdominal pain and vomiting with small amounts of blood.  She is taking omeprazole and reports complete resolution of symptoms.  She is trying to gain weight but has been unsuccessful.  She has early satiety.    Review of Systems: Pertinent positive and negative review of systems were noted in the above HPI section. All other review of systems were otherwise negative.    Current Medications, Allergies, Past Medical History, Past Surgical History, Family History and Social History were reviewed in [facility] record  Vital signs were reviewed in today's medical record. Physical Exam: General: Thin female in no acute distress   See Assessment and Plan under Problem List

## 2015-03-24 NOTE — Assessment & Plan Note (Signed)
Several marginal ulcers were identified by endoscopy.  She is now symptom-free.  I've instructed her to continue omeprazole indefinitely.

## 2015-03-24 NOTE — Assessment & Plan Note (Signed)
Patient clearly is underweight.  We had a 20 minute discussion regarding nutrition.  I've advised her to see a nutritionist to help her sort out her eating and hopefully change in weight.  Multiple questions that were answered.   20 minutes were spent with the patient and/or family.  Greater than 50% was spent in counseling and coordination of care with the patient

## 2015-03-24 NOTE — Patient Instructions (Signed)
We will refer you to a [facility] Nutritionist  We will contact you with that appointment when it becomes available  Cc. Thayer Jew Pharr,MD

## 2015-05-11 ENCOUNTER — Telehealth: Payer: Self-pay | Admitting: *Deleted

## 2015-05-11 NOTE — Telephone Encounter (Signed)
Case ID # [id number]   Omeprazole approved until 03-2015  Until  05/10/2016

## 2015-05-13 NOTE — Telephone Encounter (Signed)
APPROVED UNTIL 04/11/2015-05/10/2016 LETTER SENT IN TO BE SCANNED IN

## 2015-05-17 ENCOUNTER — Other Ambulatory Visit: Payer: Self-pay

## 2015-07-07 ENCOUNTER — Telehealth: Payer: Self-pay | Admitting: Gastroenterology

## 2015-07-07 NOTE — Telephone Encounter (Signed)
Patient verifying omeprazole prescription that had a prior authorization done back in June. Patient has not been taking the medication and is having GERD. Told her to contact her pharmacy and see if the med Omeprazole is still on file. Told her to call back as needed

## 2015-08-30 ENCOUNTER — Other Ambulatory Visit: Payer: Self-pay | Admitting: Obstetrics and Gynecology

## 2015-08-30 DIAGNOSIS — R928 Other abnormal and inconclusive findings on diagnostic imaging of breast: Secondary | ICD-10-CM

## 2015-09-09 ENCOUNTER — Ambulatory Visit
Admission: RE | Admit: 2015-09-09 | Discharge: 2015-09-09 | Disposition: A | Discharge status: Home / Self Care | Payer: BC Managed Care – PPO | Source: Ambulatory Visit | Attending: Obstetrics and Gynecology | Admitting: Obstetrics and Gynecology

## 2015-09-09 DIAGNOSIS — R928 Other abnormal and inconclusive findings on diagnostic imaging of breast: Secondary | ICD-10-CM

## 2016-02-24 ENCOUNTER — Other Ambulatory Visit: Payer: Self-pay | Admitting: Gastroenterology

## 2016-02-28 ENCOUNTER — Telehealth: Payer: Self-pay

## 2016-02-28 ENCOUNTER — Other Ambulatory Visit: Payer: Self-pay | Admitting: Gastroenterology

## 2016-02-28 MED ORDER — OMEPRAZOLE 20 MG PO CPDR: 20.0000 mg | DELAYED_RELEASE_CAPSULE | Freq: Every day | ORAL | Status: DC

## 2016-02-28 NOTE — Telephone Encounter (Signed)
90 days supply with 0 refills given. Pt aware to keep her appointment.

## 2016-02-28 NOTE — Telephone Encounter (Signed)
Ok to refill Omeprazole until follow up visit

## 2016-02-28 NOTE — Telephone Encounter (Signed)
Incoming fax request for refills on Omperazole 20 mg 1 daily.  Pt is a former Dr Deatra Ina pt. Follow up scheduled to see you in clinic 6-16-2-17. Can she have refills until appointment.

## 2016-05-05 ENCOUNTER — Ambulatory Visit: Payer: BC Managed Care – PPO | Admitting: Gastroenterology

## 2016-05-28 ENCOUNTER — Other Ambulatory Visit: Payer: Self-pay | Admitting: Gastroenterology

## 2016-08-24 ENCOUNTER — Other Ambulatory Visit: Payer: Self-pay | Admitting: Gastroenterology

## 2016-08-25 ENCOUNTER — Encounter (HOSPITAL_COMMUNITY): Payer: Self-pay

## 2017-05-12 ENCOUNTER — Emergency Department (HOSPITAL_COMMUNITY)
Admission: EM | Admit: 2017-05-12 | Discharge: 2017-05-12 | Disposition: A | Discharge status: Home / Self Care | Payer: BC Managed Care – PPO | Attending: Emergency Medicine | Admitting: Emergency Medicine

## 2017-05-12 ENCOUNTER — Encounter (HOSPITAL_COMMUNITY): Payer: Self-pay | Admitting: *Deleted

## 2017-05-12 DIAGNOSIS — I10 Essential (primary) hypertension: Secondary | ICD-10-CM | POA: Insufficient documentation

## 2017-05-12 DIAGNOSIS — E119 Type 2 diabetes mellitus without complications: Secondary | ICD-10-CM | POA: Insufficient documentation

## 2017-05-12 DIAGNOSIS — H1033 Unspecified acute conjunctivitis, bilateral: Secondary | ICD-10-CM | POA: Insufficient documentation

## 2017-05-12 DIAGNOSIS — H109 Unspecified conjunctivitis: Secondary | ICD-10-CM

## 2017-05-12 DIAGNOSIS — H16002 Unspecified corneal ulcer, left eye: Secondary | ICD-10-CM

## 2017-05-12 DIAGNOSIS — Z79899 Other long term (current) drug therapy: Secondary | ICD-10-CM | POA: Diagnosis not present

## 2017-05-12 DIAGNOSIS — Z7902 Long term (current) use of antithrombotics/antiplatelets: Secondary | ICD-10-CM | POA: Insufficient documentation

## 2017-05-12 DIAGNOSIS — J45909 Unspecified asthma, uncomplicated: Secondary | ICD-10-CM | POA: Diagnosis not present

## 2017-05-12 DIAGNOSIS — H5713 Ocular pain, bilateral: Secondary | ICD-10-CM | POA: Diagnosis present

## 2017-05-12 DIAGNOSIS — H16012 Central corneal ulcer, left eye: Secondary | ICD-10-CM | POA: Insufficient documentation

## 2017-05-12 DIAGNOSIS — Z87891 Personal history of nicotine dependence: Secondary | ICD-10-CM | POA: Insufficient documentation

## 2017-05-12 MED ORDER — FLUORESCEIN SODIUM 0.6 MG OP STRP
1.0000 | ORAL_STRIP | Freq: Once | OPHTHALMIC | Status: AC
  Administered 2017-05-12: 1 via OPHTHALMIC
  Filled 2017-05-12: qty 1

## 2017-05-12 MED ORDER — TETRACAINE HCL 0.5 % OP SOLN
2.0000 [drp] | Freq: Once | OPHTHALMIC | Status: AC
  Administered 2017-05-12: 2 [drp] via OPHTHALMIC
  Filled 2017-05-12: qty 4

## 2017-05-12 MED ORDER — TOBRAMYCIN 0.3 % OP SOLN: 2.0000 [drp] | OPHTHALMIC | 0 refills | Status: AC
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
## 2017-05-12 NOTE — ED Triage Notes (Signed)
The pt is c/o eye pain particularily on the rt but tlt is painful also.  [facility] [place].  She has not been able to get her contact lens out of her rt eye  Hx gastric bypass

## 2017-05-14 ENCOUNTER — Encounter (HOSPITAL_COMMUNITY): Payer: Self-pay

## 2019-09-11 ENCOUNTER — Ambulatory Visit: Payer: BC Managed Care – PPO | Admitting: Neurology

## 2019-09-11 ENCOUNTER — Telehealth: Payer: Self-pay | Admitting: *Deleted

## 2019-09-11 NOTE — Telephone Encounter (Signed)
No showed new patient appointment. 

## 2019-09-15 ENCOUNTER — Encounter: Payer: Self-pay | Admitting: Neurology

## 2019-11-03 ENCOUNTER — Ambulatory Visit: Payer: BC Managed Care – PPO | Admitting: Neurology

## 2020-02-26 ENCOUNTER — Other Ambulatory Visit: Payer: Self-pay | Admitting: Internal Medicine

## 2020-02-26 ENCOUNTER — Ambulatory Visit
Admission: RE | Admit: 2020-02-26 | Discharge: 2020-02-26 | Disposition: A | Discharge status: Home / Self Care | Payer: BC Managed Care – PPO | Source: Ambulatory Visit | Attending: Internal Medicine | Admitting: Internal Medicine

## 2020-02-26 DIAGNOSIS — M25471 Effusion, right ankle: Secondary | ICD-10-CM

## 2020-06-21 DIAGNOSIS — G4733 Obstructive sleep apnea (adult) (pediatric): Secondary | ICD-10-CM | POA: Diagnosis not present

## 2020-06-22 DIAGNOSIS — H25812 Combined forms of age-related cataract, left eye: Secondary | ICD-10-CM | POA: Diagnosis not present

## 2020-06-22 DIAGNOSIS — H2512 Age-related nuclear cataract, left eye: Secondary | ICD-10-CM | POA: Diagnosis not present

## 2020-07-07 DIAGNOSIS — E039 Hypothyroidism, unspecified: Secondary | ICD-10-CM | POA: Diagnosis not present

## 2020-07-07 DIAGNOSIS — E559 Vitamin D deficiency, unspecified: Secondary | ICD-10-CM | POA: Diagnosis not present

## 2020-07-22 DIAGNOSIS — G4733 Obstructive sleep apnea (adult) (pediatric): Secondary | ICD-10-CM | POA: Diagnosis not present

## 2020-08-21 DIAGNOSIS — G4733 Obstructive sleep apnea (adult) (pediatric): Secondary | ICD-10-CM | POA: Diagnosis not present

## 2020-09-02 DIAGNOSIS — N390 Urinary tract infection, site not specified: Secondary | ICD-10-CM | POA: Diagnosis not present

## 2020-09-02 DIAGNOSIS — I10 Essential (primary) hypertension: Secondary | ICD-10-CM | POA: Diagnosis not present

## 2020-09-02 DIAGNOSIS — E7800 Pure hypercholesterolemia, unspecified: Secondary | ICD-10-CM | POA: Diagnosis not present

## 2020-09-02 DIAGNOSIS — E1165 Type 2 diabetes mellitus with hyperglycemia: Secondary | ICD-10-CM | POA: Diagnosis not present

## 2020-09-07 DIAGNOSIS — G4733 Obstructive sleep apnea (adult) (pediatric): Secondary | ICD-10-CM | POA: Diagnosis not present

## 2020-09-09 DIAGNOSIS — Z6831 Body mass index (BMI) 31.0-31.9, adult: Secondary | ICD-10-CM | POA: Diagnosis not present

## 2020-09-09 DIAGNOSIS — K3184 Gastroparesis: Secondary | ICD-10-CM | POA: Diagnosis not present

## 2020-09-09 DIAGNOSIS — E1121 Type 2 diabetes mellitus with diabetic nephropathy: Secondary | ICD-10-CM | POA: Diagnosis not present

## 2020-09-09 DIAGNOSIS — M8580 Other specified disorders of bone density and structure, unspecified site: Secondary | ICD-10-CM | POA: Diagnosis not present

## 2020-09-09 DIAGNOSIS — Z23 Encounter for immunization: Secondary | ICD-10-CM | POA: Diagnosis not present

## 2020-09-09 DIAGNOSIS — Z0000 Encounter for general adult medical examination without abnormal findings: Secondary | ICD-10-CM | POA: Diagnosis not present

## 2020-09-09 DIAGNOSIS — I10 Essential (primary) hypertension: Secondary | ICD-10-CM | POA: Diagnosis not present

## 2020-09-09 DIAGNOSIS — E039 Hypothyroidism, unspecified: Secondary | ICD-10-CM | POA: Diagnosis not present

## 2020-09-09 DIAGNOSIS — D649 Anemia, unspecified: Secondary | ICD-10-CM | POA: Diagnosis not present

## 2020-09-09 DIAGNOSIS — G4733 Obstructive sleep apnea (adult) (pediatric): Secondary | ICD-10-CM | POA: Diagnosis not present

## 2020-09-21 DIAGNOSIS — G4733 Obstructive sleep apnea (adult) (pediatric): Secondary | ICD-10-CM | POA: Diagnosis not present

## 2020-10-19 DIAGNOSIS — E7800 Pure hypercholesterolemia, unspecified: Secondary | ICD-10-CM | POA: Diagnosis not present

## 2020-10-19 DIAGNOSIS — I10 Essential (primary) hypertension: Secondary | ICD-10-CM | POA: Diagnosis not present

## 2020-10-19 DIAGNOSIS — E559 Vitamin D deficiency, unspecified: Secondary | ICD-10-CM | POA: Diagnosis not present

## 2020-10-19 DIAGNOSIS — E1121 Type 2 diabetes mellitus with diabetic nephropathy: Secondary | ICD-10-CM | POA: Diagnosis not present

## 2020-10-19 DIAGNOSIS — M810 Age-related osteoporosis without current pathological fracture: Secondary | ICD-10-CM | POA: Diagnosis not present

## 2020-10-21 DIAGNOSIS — G4733 Obstructive sleep apnea (adult) (pediatric): Secondary | ICD-10-CM | POA: Diagnosis not present

## 2020-11-02 DIAGNOSIS — E7800 Pure hypercholesterolemia, unspecified: Secondary | ICD-10-CM | POA: Diagnosis not present

## 2020-11-02 DIAGNOSIS — I10 Essential (primary) hypertension: Secondary | ICD-10-CM | POA: Diagnosis not present

## 2020-11-09 DIAGNOSIS — D485 Neoplasm of uncertain behavior of skin: Secondary | ICD-10-CM | POA: Diagnosis not present

## 2020-11-09 DIAGNOSIS — D229 Melanocytic nevi, unspecified: Secondary | ICD-10-CM | POA: Diagnosis not present

## 2020-11-09 DIAGNOSIS — Z23 Encounter for immunization: Secondary | ICD-10-CM | POA: Diagnosis not present

## 2020-11-21 DIAGNOSIS — G4733 Obstructive sleep apnea (adult) (pediatric): Secondary | ICD-10-CM | POA: Diagnosis not present

## 2020-12-16 DIAGNOSIS — D485 Neoplasm of uncertain behavior of skin: Secondary | ICD-10-CM | POA: Diagnosis not present

## 2020-12-16 DIAGNOSIS — L988 Other specified disorders of the skin and subcutaneous tissue: Secondary | ICD-10-CM | POA: Diagnosis not present

## 2020-12-22 DIAGNOSIS — G4733 Obstructive sleep apnea (adult) (pediatric): Secondary | ICD-10-CM | POA: Diagnosis not present

## 2020-12-27 DIAGNOSIS — Z86010 Personal history of colonic polyps: Secondary | ICD-10-CM | POA: Diagnosis not present

## 2020-12-27 DIAGNOSIS — I10 Essential (primary) hypertension: Secondary | ICD-10-CM | POA: Diagnosis not present

## 2020-12-27 DIAGNOSIS — R197 Diarrhea, unspecified: Secondary | ICD-10-CM | POA: Diagnosis not present

## 2020-12-28 DIAGNOSIS — M792 Neuralgia and neuritis, unspecified: Secondary | ICD-10-CM | POA: Diagnosis not present

## 2020-12-28 DIAGNOSIS — E7800 Pure hypercholesterolemia, unspecified: Secondary | ICD-10-CM | POA: Diagnosis not present

## 2020-12-28 DIAGNOSIS — I10 Essential (primary) hypertension: Secondary | ICD-10-CM | POA: Diagnosis not present

## 2020-12-28 DIAGNOSIS — E1165 Type 2 diabetes mellitus with hyperglycemia: Secondary | ICD-10-CM | POA: Diagnosis not present

## 2020-12-28 DIAGNOSIS — E039 Hypothyroidism, unspecified: Secondary | ICD-10-CM | POA: Diagnosis not present

## 2021-01-19 DIAGNOSIS — G4733 Obstructive sleep apnea (adult) (pediatric): Secondary | ICD-10-CM | POA: Diagnosis not present

## 2021-02-01 DIAGNOSIS — D124 Benign neoplasm of descending colon: Secondary | ICD-10-CM | POA: Diagnosis not present

## 2021-02-01 DIAGNOSIS — D120 Benign neoplasm of cecum: Secondary | ICD-10-CM | POA: Diagnosis not present

## 2021-02-01 DIAGNOSIS — K5730 Diverticulosis of large intestine without perforation or abscess without bleeding: Secondary | ICD-10-CM | POA: Diagnosis not present

## 2021-02-01 DIAGNOSIS — K635 Polyp of colon: Secondary | ICD-10-CM | POA: Diagnosis not present

## 2021-02-01 DIAGNOSIS — K625 Hemorrhage of anus and rectum: Secondary | ICD-10-CM | POA: Diagnosis not present

## 2021-02-01 DIAGNOSIS — Z1211 Encounter for screening for malignant neoplasm of colon: Secondary | ICD-10-CM | POA: Diagnosis not present

## 2021-02-01 DIAGNOSIS — D123 Benign neoplasm of transverse colon: Secondary | ICD-10-CM | POA: Diagnosis not present

## 2021-02-19 DIAGNOSIS — G4733 Obstructive sleep apnea (adult) (pediatric): Secondary | ICD-10-CM | POA: Diagnosis not present

## 2021-03-03 ENCOUNTER — Other Ambulatory Visit: Payer: Self-pay

## 2021-03-03 ENCOUNTER — Observation Stay (HOSPITAL_COMMUNITY)
Admission: EM | Admit: 2021-03-03 | Discharge: 2021-03-04 | Disposition: A | Discharge status: Home / Self Care | Payer: Medicare PPO | Attending: Internal Medicine | Admitting: Internal Medicine

## 2021-03-03 ENCOUNTER — Encounter (HOSPITAL_COMMUNITY): Payer: Self-pay | Admitting: Emergency Medicine

## 2021-03-03 ENCOUNTER — Emergency Department (HOSPITAL_COMMUNITY): Payer: Medicare PPO

## 2021-03-03 DIAGNOSIS — N289 Disorder of kidney and ureter, unspecified: Secondary | ICD-10-CM | POA: Diagnosis not present

## 2021-03-03 DIAGNOSIS — R42 Dizziness and giddiness: Secondary | ICD-10-CM | POA: Diagnosis not present

## 2021-03-03 DIAGNOSIS — Y92512 Supermarket, store or market as the place of occurrence of the external cause: Secondary | ICD-10-CM | POA: Insufficient documentation

## 2021-03-03 DIAGNOSIS — E119 Type 2 diabetes mellitus without complications: Secondary | ICD-10-CM | POA: Diagnosis not present

## 2021-03-03 DIAGNOSIS — Y9301 Activity, walking, marching and hiking: Secondary | ICD-10-CM | POA: Insufficient documentation

## 2021-03-03 DIAGNOSIS — W01198A Fall on same level from slipping, tripping and stumbling with subsequent striking against other object, initial encounter: Secondary | ICD-10-CM | POA: Diagnosis not present

## 2021-03-03 DIAGNOSIS — N179 Acute kidney failure, unspecified: Secondary | ICD-10-CM

## 2021-03-03 DIAGNOSIS — R55 Syncope and collapse: Secondary | ICD-10-CM | POA: Diagnosis present

## 2021-03-03 DIAGNOSIS — Z7984 Long term (current) use of oral hypoglycemic drugs: Secondary | ICD-10-CM | POA: Insufficient documentation

## 2021-03-03 DIAGNOSIS — E039 Hypothyroidism, unspecified: Secondary | ICD-10-CM | POA: Insufficient documentation

## 2021-03-03 DIAGNOSIS — G4733 Obstructive sleep apnea (adult) (pediatric): Secondary | ICD-10-CM | POA: Diagnosis not present

## 2021-03-03 DIAGNOSIS — Z20822 Contact with and (suspected) exposure to covid-19: Secondary | ICD-10-CM | POA: Diagnosis not present

## 2021-03-03 DIAGNOSIS — I10 Essential (primary) hypertension: Secondary | ICD-10-CM

## 2021-03-03 DIAGNOSIS — Z87891 Personal history of nicotine dependence: Secondary | ICD-10-CM | POA: Diagnosis not present

## 2021-03-03 DIAGNOSIS — E876 Hypokalemia: Secondary | ICD-10-CM | POA: Diagnosis not present

## 2021-03-03 DIAGNOSIS — S0990XA Unspecified injury of head, initial encounter: Secondary | ICD-10-CM | POA: Diagnosis not present

## 2021-03-03 DIAGNOSIS — Z79899 Other long term (current) drug therapy: Secondary | ICD-10-CM | POA: Diagnosis not present

## 2021-03-03 DIAGNOSIS — J45909 Unspecified asthma, uncomplicated: Secondary | ICD-10-CM | POA: Insufficient documentation

## 2021-03-03 LAB — CBC WITH DIFFERENTIAL/PLATELET
Abs Immature Granulocytes: 0.06 10*3/uL (ref 0.00–0.07)
Basophils Absolute: 0 10*3/uL (ref 0.0–0.1)
Basophils Relative: 0 %
Eosinophils Absolute: 0 10*3/uL (ref 0.0–0.5)
Eosinophils Relative: 0 %
HCT: 36.2 % (ref 36.0–46.0)
Hemoglobin: 11.4 g/dL — ABNORMAL LOW (ref 12.0–15.0)
Immature Granulocytes: 1 %
Lymphocytes Relative: 20 %
Lymphs Abs: 2.4 10*3/uL (ref 0.7–4.0)
MCH: 26.7 pg (ref 26.0–34.0)
MCHC: 31.5 g/dL (ref 30.0–36.0)
MCV: 84.8 fL (ref 80.0–100.0)
Monocytes Absolute: 0.6 10*3/uL (ref 0.1–1.0)
Monocytes Relative: 5 %
Neutro Abs: 9 10*3/uL — ABNORMAL HIGH (ref 1.7–7.7)
Neutrophils Relative %: 74 %
Platelets: 302 10*3/uL (ref 150–400)
RBC: 4.27 MIL/uL (ref 3.87–5.11)
RDW: 16.8 % — ABNORMAL HIGH (ref 11.5–15.5)
WBC: 12.1 10*3/uL — ABNORMAL HIGH (ref 4.0–10.5)
nRBC: 0 % (ref 0.0–0.2)

## 2021-03-03 LAB — COMPREHENSIVE METABOLIC PANEL
ALT: 12 U/L (ref 0–44)
AST: 18 U/L (ref 15–41)
Albumin: 3.9 g/dL (ref 3.5–5.0)
Alkaline Phosphatase: 94 U/L (ref 38–126)
Anion gap: 11 (ref 5–15)
BUN: 12 mg/dL (ref 8–23)
CO2: 25 mmol/L (ref 22–32)
Calcium: 8.8 mg/dL — ABNORMAL LOW (ref 8.9–10.3)
Chloride: 102 mmol/L (ref 98–111)
Creatinine, Ser: 1.38 mg/dL — ABNORMAL HIGH (ref 0.44–1.00)
GFR, Estimated: 42 mL/min — ABNORMAL LOW (ref >60)
Glucose, Bld: 134 mg/dL — ABNORMAL HIGH (ref 70–99)
Potassium: 2.9 mmol/L — ABNORMAL LOW (ref 3.5–5.1)
Sodium: 138 mmol/L (ref 135–145)
Total Bilirubin: 0.9 mg/dL (ref 0.3–1.2)
Total Protein: 7.3 g/dL (ref 6.5–8.1)

## 2021-03-03 LAB — MAGNESIUM: Magnesium: 1.6 mg/dL — ABNORMAL LOW (ref 1.7–2.4)

## 2021-03-03 MED ORDER — POTASSIUM CHLORIDE CRYS ER 20 MEQ PO TBCR
40.0000 meq | EXTENDED_RELEASE_TABLET | Freq: Once | ORAL | Status: AC
  Administered 2021-03-03: 40 meq via ORAL
  Filled 2021-03-03: qty 2

## 2021-03-03 MED ORDER — POTASSIUM CHLORIDE 10 MEQ/100ML IV SOLN
10.0000 meq | Freq: Once | INTRAVENOUS | Status: AC
  Administered 2021-03-03: 10 meq via INTRAVENOUS
  Filled 2021-03-03: qty 100

## 2021-03-03 MED ORDER — MAGNESIUM SULFATE 4 GM/100ML IV SOLN
4.0000 g | Freq: Once | INTRAVENOUS | Status: AC
  Administered 2021-03-04: 4 g via INTRAVENOUS
  Filled 2021-03-03: qty 100

## 2021-03-03 MED ORDER — POTASSIUM CHLORIDE IN NACL 20-0.9 MEQ/L-% IV SOLN
INTRAVENOUS | Status: AC
  Filled 2021-03-03: qty 1000

## 2021-03-03 MED ORDER — SODIUM CHLORIDE 0.9 % IV BOLUS
1000.0000 mL | Freq: Once | INTRAVENOUS | Status: AC
  Administered 2021-03-03: 1000 mL via INTRAVENOUS
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
## 2021-03-03 NOTE — ED Triage Notes (Signed)
Patient c/o dizziness for 2 days with syncopal episode and fall in [place] today. Laceration to right head. CBG 212 after fall.

## 2021-03-04 ENCOUNTER — Observation Stay (HOSPITAL_BASED_OUTPATIENT_CLINIC_OR_DEPARTMENT_OTHER): Payer: Medicare PPO

## 2021-03-04 DIAGNOSIS — E876 Hypokalemia: Secondary | ICD-10-CM | POA: Diagnosis not present

## 2021-03-04 DIAGNOSIS — N289 Disorder of kidney and ureter, unspecified: Secondary | ICD-10-CM | POA: Diagnosis not present

## 2021-03-04 DIAGNOSIS — E039 Hypothyroidism, unspecified: Secondary | ICD-10-CM | POA: Diagnosis not present

## 2021-03-04 DIAGNOSIS — R55 Syncope and collapse: Secondary | ICD-10-CM | POA: Diagnosis not present

## 2021-03-04 DIAGNOSIS — E119 Type 2 diabetes mellitus without complications: Secondary | ICD-10-CM | POA: Diagnosis not present

## 2021-03-04 DIAGNOSIS — I10 Essential (primary) hypertension: Secondary | ICD-10-CM | POA: Diagnosis not present

## 2021-03-04 LAB — BASIC METABOLIC PANEL
Anion gap: 9 (ref 5–15)
BUN: 12 mg/dL (ref 8–23)
CO2: 23 mmol/L (ref 22–32)
Calcium: 8.4 mg/dL — ABNORMAL LOW (ref 8.9–10.3)
Chloride: 108 mmol/L (ref 98–111)
Creatinine, Ser: 1.27 mg/dL — ABNORMAL HIGH (ref 0.44–1.00)
GFR, Estimated: 47 mL/min — ABNORMAL LOW (ref >60)
Glucose, Bld: 109 mg/dL — ABNORMAL HIGH (ref 70–99)
Potassium: 3.3 mmol/L — ABNORMAL LOW (ref 3.5–5.1)
Sodium: 140 mmol/L (ref 135–145)

## 2021-03-04 LAB — HEMOGLOBIN A1C
Hgb A1c MFr Bld: 6.9 % — ABNORMAL HIGH (ref 4.8–5.6)
Mean Plasma Glucose: 151.33 mg/dL

## 2021-03-04 LAB — ECHOCARDIOGRAM COMPLETE
AR max vel: 2.19 cm2
AV Area VTI: 2.26 cm2
AV Area mean vel: 1.96 cm2
AV Mean grad: 5 mmHg
AV Peak grad: 10.4 mmHg
Ao pk vel: 1.61 m/s
Area-P 1/2: 3.6 cm2
Height: 63 in
S' Lateral: 2.78 cm
Single Plane A2C EF: 70 %
Weight: 2419.77 oz

## 2021-03-04 LAB — TSH: TSH: 1.429 u[IU]/mL (ref 0.350–4.500)

## 2021-03-04 LAB — GLUCOSE, CAPILLARY
Glucose-Capillary: 121 mg/dL — ABNORMAL HIGH (ref 70–99)
Glucose-Capillary: 131 mg/dL — ABNORMAL HIGH (ref 70–99)
Glucose-Capillary: 126 mg/dL — ABNORMAL HIGH (ref 70–99)
Glucose-Capillary: 119 mg/dL — ABNORMAL HIGH (ref 70–99)

## 2021-03-04 LAB — CBC
HCT: 31.7 % — ABNORMAL LOW (ref 36.0–46.0)
Hemoglobin: 10.1 g/dL — ABNORMAL LOW (ref 12.0–15.0)
MCH: 27.4 pg (ref 26.0–34.0)
MCHC: 31.9 g/dL (ref 30.0–36.0)
MCV: 86.1 fL (ref 80.0–100.0)
Platelets: 234 10*3/uL (ref 150–400)
RBC: 3.68 MIL/uL — ABNORMAL LOW (ref 3.87–5.11)
RDW: 16.8 % — ABNORMAL HIGH (ref 11.5–15.5)
WBC: 9.8 10*3/uL (ref 4.0–10.5)
nRBC: 0 % (ref 0.0–0.2)

## 2021-03-04 LAB — SARS CORONAVIRUS 2 (TAT 6-24 HRS): SARS Coronavirus 2: NEGATIVE

## 2021-03-04 LAB — HIV ANTIBODY (ROUTINE TESTING W REFLEX): HIV Screen 4th Generation wRfx: NONREACTIVE

## 2021-03-04 LAB — MAGNESIUM: Magnesium: 1.8 mg/dL (ref 1.7–2.4)

## 2021-03-04 MED ORDER — INSULIN ASPART 100 UNIT/ML ~~LOC~~ SOLN
0.0000 [IU] | Freq: Three times a day (TID) | SUBCUTANEOUS | Status: DC
  Administered 2021-03-04: 1 [IU] via SUBCUTANEOUS
  Filled 2021-03-04: qty 0.09

## 2021-03-04 MED ORDER — VORTIOXETINE HBR 5 MG PO TABS
20.0000 mg | ORAL_TABLET | Freq: Every day | ORAL | Status: DC
  Administered 2021-03-04: 20 mg via ORAL
  Filled 2021-03-04: qty 4

## 2021-03-04 MED ORDER — ROSUVASTATIN CALCIUM 10 MG PO TABS
10.0000 mg | ORAL_TABLET | Freq: Every day | ORAL | Status: DC
  Administered 2021-03-04: 10 mg via ORAL
  Filled 2021-03-04: qty 1

## 2021-03-04 MED ORDER — LEVOTHYROXINE SODIUM 50 MCG PO TABS
75.0000 ug | ORAL_TABLET | Freq: Every day | ORAL | Status: DC
  Administered 2021-03-04: 75 ug via ORAL
  Filled 2021-03-04: qty 1

## 2021-03-04 MED ORDER — ACETAMINOPHEN 650 MG RE SUPP: 650.0000 mg | Freq: Four times a day (QID) | RECTAL | Status: DC | PRN

## 2021-03-04 MED ORDER — MAGNESIUM SULFATE 2 GM/50ML IV SOLN
2.0000 g | Freq: Once | INTRAVENOUS | Status: AC
  Administered 2021-03-04: 2 g via INTRAVENOUS
  Filled 2021-03-04: qty 50

## 2021-03-04 MED ORDER — POLYETHYLENE GLYCOL 3350 17 G PO PACK: 17.0000 g | PACK | Freq: Every day | ORAL | Status: DC | PRN

## 2021-03-04 MED ORDER — ACETAMINOPHEN 325 MG PO TABS
650.0000 mg | ORAL_TABLET | Freq: Four times a day (QID) | ORAL | Status: DC | PRN
  Filled 2021-03-04: qty 2

## 2021-03-04 MED ORDER — ENOXAPARIN SODIUM 40 MG/0.4ML ~~LOC~~ SOLN
40.0000 mg | SUBCUTANEOUS | Status: DC
  Administered 2021-03-04: 40 mg via SUBCUTANEOUS
  Filled 2021-03-04: qty 0.4

## 2021-03-04 MED ORDER — POTASSIUM CHLORIDE CRYS ER 20 MEQ PO TBCR
40.0000 meq | EXTENDED_RELEASE_TABLET | Freq: Once | ORAL | Status: AC
  Administered 2021-03-04: 40 meq via ORAL
  Filled 2021-03-04: qty 2

## 2021-03-04 MED ORDER — ORAL CARE MOUTH RINSE
15.0000 mL | Freq: Two times a day (BID) | OROMUCOSAL | Status: DC
  Administered 2021-03-04: 15 mL via OROMUCOSAL
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
## 2021-03-04 NOTE — Discharge Summary (Signed)
Physician Discharge Summary  Toni Palmer EGB:[id number] DOB: 04/01/1956 DOA: 03/03/2021  PCP: Toni Pretty, MD  Admit date: 03/03/2021 Discharge date: 03/04/2021  Admitted From: Observation Disposition: home  Recommendations for Outpatient Follow-up:  1. Follow up with PCP in 1-2 weeks   Home Health:No Equipment/Devices:none  Discharge Condition:Stable CODE STATUS:Full code Diet recommendation: Diabetic diet  Brief/Interim Summary: Per admitting MD: Toni Palmer is a 65 y.o. female with medical history significant for hypertension, type 2 diabetes mellitus, OSA on CPAP, hypothyroidism, memory loss, and depression, now presenting to the emergency department for evaluation of dizziness and syncope.  Patient reports lightheadedness, mainly upon standing for the past 2 days, received an alert on her Apple watch that her heart rate was <40 this morning while she was sleeping, and then suffered a syncopal episode while shopping today.  She had been walking in a store when she developed acute onset of hot and clammy sensation and then woke up on the floor.  Family member witnessed the event and there was no seizure-like activity and the patient regained awareness quickly.  She has not had any palpitations or chest pain.  She denies any recent nausea, vomiting, diarrhea, or change in medications.  She reports taking Lasix daily for chronic leg swelling though notes her legs have not been swollen for some time.  ED Course: Upon arrival to the ED, patient is found to be afebrile, saturating well on room air, heart rate 52-74, and blood pressure 105/66.  EKG features sinus rhythm with PACs.  No acute findings noted on CT head and cervical spine.  Mr. Panel features a creatinine 1.38, potassium 2.9, and magnesium 1.6.  CBC with WBC 12,100.  Patient was given a liter of saline, 40 mEq oral potassium, and 10 mEq IV potassium in the ED.  Hospital course: Syncope as noted on admission patient  reports feeling lightheaded on standing and was found t dissipate patient be stable for discharge and counseled on adequate fluid intake in the setting of Lasix use.  O be orthostatic in the ED.  She was initially placed on cardiac monitoring fluid hydration.  Her Lasix was held and orthostatics were repeated in the morning.  I requested orthostatics to be repeated this afternoon and they are normal.  Patient also had electrolyte disarray with low potassium and low magnesium both of which were repleted.  She has had no episodes of bradycardia or syncope while in the hospital.  Echocardiogram is pending.  If normal I Anticipate patient will be stable for discharge.  She was counseled on adequate fluid intake in the setting of Lasix use.  Patient will continue her home medications and management through her PCP for chronic renal insufficiency, hypertension, depression, hypothyroid and type 2 diabetes.  Discharge Diagnoses:  Principal Problem:   Syncope Active Problems:   Hypothyroidism   Diabetes mellitus type II, non insulin dependent (HCC)   OBSTRUCTIVE SLEEP APNEA   Essential hypertension   Asthma   Mild renal insufficiency   Hypokalemia   Hypomagnesemia    Discharge Instructions  Discharge Instructions    Call MD for:   Complete by: As directed    Any acute changes in medical condition to include but not limited to slow or rapid heart rate, nausea vomiting chest pain or shortness of breath.   Call MD for:  difficulty breathing, headache or visual disturbances   Complete by: As directed    Call MD for:  temperature >100.4   Complete by: As directed  Diet Carb Modified   Complete by: As directed    Increase activity slowly   Complete by: As directed      Allergies as of 03/04/2021   No Known Allergies     Medication List    STOP taking these medications   fluticasone 50 MCG/ACT nasal spray Commonly known as: FLONASE   Fluticasone-Salmeterol 250-50 MCG/DOSE Aepb Commonly  known as: Advair Diskus     TAKE these medications   AMBULATORY NON FORMULARY MEDICATION 1 Device by Does not apply route as needed. Medication Name: [employer] food scale   Biotin 5000 MCG Tabs Take 1 tablet by mouth daily.   calcium citrate 950 (200 Ca) MG tablet Commonly known as: CALCITRATE - dosed in mg elemental calcium Take 1 tablet by mouth daily.   dexmethylphenidate 20 MG 24 hr capsule Commonly known as: FOCALIN XR Take 40 mg by mouth daily.   donepezil 10 MG tablet Commonly known as: ARICEPT Take 10 mg by mouth daily.   furosemide 20 MG tablet Commonly known as: LASIX Take 20 mg by mouth daily.   hydrocortisone butyrate 0.1 % Crea cream Commonly known as: LUCOID Apply 1 application topically daily.   levothyroxine 75 MCG tablet Commonly known as: SYNTHROID Take 75 mcg by mouth daily before breakfast.   losartan 50 MG tablet Commonly known as: COZAAR Take 1 tablet by mouth daily.   metFORMIN 500 MG 24 hr tablet Commonly known as: GLUCOPHAGE-XR Take 500 mg by mouth daily in the afternoon.   montelukast 10 MG tablet Commonly known as: SINGULAIR Take 1 tablet by mouth daily.   MULTIVITAMIN PO Take 1 tablet by mouth 2 (two) times daily.   omeprazole 20 MG capsule Commonly known as: PRILOSEC TAKE 1 CAPSULE(20 MG) BY MOUTH DAILY   rosuvastatin 10 MG tablet Commonly known as: CRESTOR Take 10 mg by mouth daily.   traZODone 100 MG tablet Commonly known as: DESYREL Take 200 mg by mouth At bedtime.   Trintellix 20 MG Tabs tablet Generic drug: vortioxetine HBr Take 20 mg by mouth daily.   VITAMIN C PO Take 1 tablet by mouth daily.   Vitamin D (Ergocalciferol) 1.25 MG (50000 UNIT) Caps capsule Commonly known as: DRISDOL Take 1 capsule by mouth once a week.       No Known Allergies  Consultations:  None   Procedures/Studies: CT Head Wo Contrast  Result Date: 03/03/2021 CLINICAL DATA:  Dizziness for 2 days, syncope, fell EXAM: CT HEAD  WITHOUT CONTRAST TECHNIQUE: Contiguous axial images were obtained from the base of the skull through the vertex without intravenous contrast. COMPARISON:  None. FINDINGS: Brain: No acute infarct or hemorrhage. Lateral ventricles and midline structures are unremarkable. No acute extra-axial fluid collections. No mass effect. Vascular: No hyperdense vessel or unexpected calcification. Skull: Normal. Negative for fracture or focal lesion. Sinuses/Orbits: Polypoid mucosal thickening left maxillary sinus. Remaining sinuses are clear. Other: None. IMPRESSION: 1. No acute intracranial process. Electronically Signed   By: Randa Ngo M.D.   On: 03/03/2021 20:42   CT Cervical Spine Wo Contrast  Result Date: 03/03/2021 CLINICAL DATA:  Fall, syncope EXAM: CT CERVICAL SPINE WITHOUT CONTRAST TECHNIQUE: Multidetector CT imaging of the cervical spine was performed without intravenous contrast. Multiplanar CT image reconstructions were also generated. COMPARISON:  None. FINDINGS: Alignment: Normal. Skull base and vertebrae: No acute fracture. No primary bone lesion or focal pathologic process. Soft tissues and spinal canal: No prevertebral fluid or swelling. No visible canal hematoma. Disc levels:  Intact. Upper  chest: Negative. Other: None. IMPRESSION: No fracture or static subluxation of the cervical spine. Electronically Signed   By: Eddie Candle M.D.   On: 03/03/2021 20:47   ECHOCARDIOGRAM COMPLETE  Result Date: 03/04/2021    ECHOCARDIOGRAM REPORT   Patient Name:   Toni Palmer Date of Exam: 03/04/2021 Medical Rec #:  [id number]        Height:       63.0 in Accession #:    [id number]       Weight:       151.2 lb Date of Birth:  1955-12-02        BSA:          1.717 m Patient Age:    5 years         BP:           104/62 mmHg Patient Gender: F                HR:           51 bpm. Exam Location:  Inpatient Procedure: 2D Echo, Cardiac Doppler and Color Doppler Indications:    Syncope  History:        Patient has no  prior history of Echocardiogram examinations.                 Risk Factors:Hypertension, Dyslipidemia and Diabetes.  Sonographer:    Luisa Hart RDCS Referring Phys: [id number] [place]  1. Left ventricular ejection fraction, by estimation, is 60 to 65%. The left ventricle has normal function. The left ventricle has no regional wall motion abnormalities. Left ventricular diastolic parameters were normal.  2. Right ventricular systolic function is normal. The right ventricular size is normal. There is normal pulmonary artery systolic pressure.  3. The mitral valve is normal in structure. Trivial mitral valve regurgitation. No evidence of mitral stenosis.  4. The aortic valve is tricuspid. Aortic valve regurgitation is not visualized. No aortic stenosis is present.  5. The inferior vena cava is normal in size with greater than 50% respiratory variability, suggesting right atrial pressure of 3 mmHg. Comparison(s): No prior Echocardiogram. Conclusion(s)/Recommendation(s): Normal biventricular function without evidence of hemodynamically significant valvular heart disease. FINDINGS  Left Ventricle: Left ventricular ejection fraction, by estimation, is 60 to 65%. The left ventricle has normal function. The left ventricle has no regional wall motion abnormalities. The left ventricular internal cavity size was normal in size. There is  no left ventricular hypertrophy. Left ventricular diastolic parameters were normal. Right Ventricle: The right ventricular size is normal. No increase in right ventricular wall thickness. Right ventricular systolic function is normal. There is normal pulmonary artery systolic pressure. The tricuspid regurgitant velocity is 2.51 m/s, and  with an assumed right atrial pressure of 3 mmHg, the estimated right ventricular systolic pressure is 03.0 mmHg. Left Atrium: Left atrial size was normal in size. Right Atrium: Right atrial size was normal in size. Pericardium: There is no  evidence of pericardial effusion. Mitral Valve: The mitral valve is normal in structure. Trivial mitral valve regurgitation. No evidence of mitral valve stenosis. Tricuspid Valve: The tricuspid valve is normal in structure. Tricuspid valve regurgitation is trivial. No evidence of tricuspid stenosis. Aortic Valve: The aortic valve is tricuspid. Aortic valve regurgitation is not visualized. No aortic stenosis is present. Aortic valve mean gradient measures 5.0 mmHg. Aortic valve peak gradient measures 10.4 mmHg. Aortic valve area, by VTI measures 2.26  cm. Pulmonic Valve: The pulmonic valve was not well  visualized. Pulmonic valve regurgitation is not visualized. No evidence of pulmonic stenosis. Aorta: The aortic root, ascending aorta and aortic arch are all structurally normal, with no evidence of dilitation or obstruction. Venous: The inferior vena cava is normal in size with greater than 50% respiratory variability, suggesting right atrial pressure of 3 mmHg. IAS/Shunts: The atrial septum is grossly normal.  LEFT VENTRICLE PLAX 2D LVIDd:         4.59 cm     Diastology LVIDs:         2.78 cm     LV e' medial:    10.10 cm/s LV PW:         0.81 cm     LV E/e' medial:  10.1 LV IVS:        0.76 cm     LV e' lateral:   10.40 cm/s LVOT diam:     2.00 cm     LV E/e' lateral: 9.8 LV SV:         84 LV SV Index:   49 LVOT Area:     3.14 cm  LV Volumes (MOD) LV vol d, MOD A2C: 73.4 ml LV vol s, MOD A2C: 22.0 ml LV SV MOD A2C:     51.4 ml  PULMONARY VEINS A Reversal Duration: 95.00 msec A Reversal Velocity: 23.76 cm/s Diastolic Velocity:  28.31 cm/s S/D Velocity:        5.17 Systolic Velocity:   61.60 cm/s LEFT ATRIUM         Index LA diam:    2.20 cm 1.28 cm/m  AORTIC VALVE                    PULMONIC VALVE AV Area (Vmax):    2.19 cm     PV Vmax:       0.91 m/s AV Area (Vmean):   1.96 cm     PV Vmean:      69.000 cm/s AV Area (VTI):     2.26 cm     PV VTI:        0.237 m AV Vmax:           161.00 cm/s  PV Peak grad:  3.3  mmHg AV Vmean:          107.000 cm/s PV Mean grad:  2.0 mmHg AV VTI:            0.371 m AV Peak Grad:      10.4 mmHg AV Mean Grad:      5.0 mmHg LVOT Vmax:         112.00 cm/s LVOT Vmean:        66.900 cm/s LVOT VTI:          0.267 m LVOT/AV VTI ratio: 0.72  AORTA Ao Root diam: 3.70 cm Ao Asc diam:  3.20 cm MITRAL VALVE                TRICUSPID VALVE MV Area (PHT): 3.60 cm     TR Peak grad:   25.2 mmHg MV Decel Time: 211 msec     TR Vmax:        251.00 cm/s MV E velocity: 102.00 cm/s MV A velocity: 95.60 cm/s   SHUNTS MV E/A ratio:  1.07         Systemic VTI:  0.27 m  Systemic Diam: 2.00 cm Buford Dresser MD Electronically signed by Buford Dresser MD Signature Date/Time: 03/04/2021/4:10:12 PM    Final        Subjective: Reports feeling well No sx Requested to be discharged home  Discharge Exam: Vitals:   03/04/21 0611 03/04/21 1434  BP: 104/62 120/62  Pulse: (!) 52 (!) 51  Resp: 20 20  Temp: 98.1 F (36.7 C) 98.2 F (36.8 C)  SpO2: 96% 98%   Vitals:   03/04/21 0150 03/04/21 0221 03/04/21 0611 03/04/21 1434  BP: 115/62 115/62 104/62 120/62  Pulse: 68 68 (!) 52 (!) 51  Resp: 18 18 20 20   Temp: 98.4 F (36.9 C) 98.4 F (36.9 C) 98.1 F (36.7 C) 98.2 F (36.8 C)  TempSrc: Oral Oral Oral Oral  SpO2: 98%  96% 98%  Weight:  68.6 kg    Height:  5\' 3"  (1.6 m)      General: Pt is alert, awake, not in acute distress Cardiovascular: RRR, S1/S2 +, no rubs, no gallops Respiratory: CTA bilaterally, no wheezing, no rhonchi Abdominal: Soft, NT, ND, bowel sounds + Extremities: no edema, no cyanosis    The results of significant diagnostics from this hospitalization (including imaging, microbiology, ancillary and laboratory) are listed below for reference.     Microbiology: Recent Results (from the past 240 hour(s))  SARS CORONAVIRUS 2 (TAT 6-24 HRS) Nasopharyngeal Nasopharyngeal Swab     Status: None   Collection Time: 03/03/21 10:57 PM    Specimen: Nasopharyngeal Swab  Result Value Ref Range Status   SARS Coronavirus 2 NEGATIVE NEGATIVE Final    Comment: (NOTE) SARS-CoV-2 target nucleic acids are NOT DETECTED.  The SARS-CoV-2 RNA is generally detectable in upper and lower respiratory specimens during the acute phase of infection. Negative results do not preclude SARS-CoV-2 infection, do not rule out co-infections with other pathogens, and should not be used as the sole basis for treatment or other patient management decisions. Negative results must be combined with clinical observations, patient history, and epidemiological information. The expected result is Negative.  Fact Sheet for Patients: SugarRoll.be  Fact Sheet for Healthcare Providers: https://www.woods-mathews.com/  This test is not yet approved or cleared by the [place] FDA and  has been authorized for detection and/or diagnosis of SARS-CoV-2 by FDA under an Emergency Use Authorization (EUA). This EUA will remain  in effect (meaning this test can be used) for the duration of the COVID-19 declaration under Se ction 564(b)(1) of the Act, 21 U.S.C. section 360bbb-3(b)(1), unless the authorization is terminated or revoked sooner.  Performed at [facility], [place] [address], [place], [place] [address]      Labs: BNP (last 3 results) No results for input(s): BNP in the last 8760 hours. Basic Metabolic Panel: Recent Labs  Lab 03/03/21 1914 03/04/21 0041  NA 138 140  K 2.9* 3.3*  CL 102 108  CO2 25 23  GLUCOSE 134* 109*  BUN 12 12  CREATININE 1.38* 1.27*  CALCIUM 8.8* 8.4*  MG 1.6* 1.8   Liver Function Tests: Recent Labs  Lab 03/03/21 1914  AST 18  ALT 12  ALKPHOS 94  BILITOT 0.9  PROT 7.3  ALBUMIN 3.9   No results for input(s): LIPASE, AMYLASE in the last 168 hours. No results for input(s): AMMONIA in the last 168 hours. CBC: Recent Labs  Lab 03/03/21 1914 03/04/21 0041  WBC  12.1* 9.8  NEUTROABS 9.0*  --   HGB 11.4* 10.1*  HCT 36.2 31.7*  MCV  84.8 86.1  PLT 302 234   Cardiac Enzymes: No results for input(s): CKTOTAL, CKMB, CKMBINDEX, TROPONINI in the last 168 hours. BNP: Invalid input(s): POCBNP CBG: Recent Labs  Lab 03/04/21 0627 03/04/21 0832 03/04/21 1140 03/04/21 1630  GLUCAP 121* 131* 126* 119*   D-Dimer No results for input(s): DDIMER in the last 72 hours. Hgb A1c Recent Labs    03/04/21 0041  HGBA1C 6.9*   Lipid Profile No results for input(s): CHOL, HDL, LDLCALC, TRIG, CHOLHDL, LDLDIRECT in the last 72 hours. Thyroid function studies Recent Labs    03/04/21 0041  TSH 1.429   Anemia work up No results for input(s): VITAMINB12, FOLATE, FERRITIN, TIBC, IRON, RETICCTPCT in the last 72 hours. Urinalysis    Component Value Date/Time   LABSPEC 1.020 08/07/2007 1606   PHURINE 5.0 08/07/2007 1606   HGBUR negative 08/07/2007 1606   BILIRUBINUR negative 08/07/2007 1606   UROBILINOGEN negative 08/07/2007 1606   NITRITE negative 08/07/2007 1606   Sepsis Labs Invalid input(s): PROCALCITONIN,  WBC,  LACTICIDVEN Microbiology Recent Results (from the past 240 hour(s))  SARS CORONAVIRUS 2 (TAT 6-24 HRS) Nasopharyngeal Nasopharyngeal Swab     Status: None   Collection Time: 03/03/21 10:57 PM   Specimen: Nasopharyngeal Swab  Result Value Ref Range Status   SARS Coronavirus 2 NEGATIVE NEGATIVE Final    Comment: (NOTE) SARS-CoV-2 target nucleic acids are NOT DETECTED.  The SARS-CoV-2 RNA is generally detectable in upper and lower respiratory specimens during the acute phase of infection. Negative results do not preclude SARS-CoV-2 infection, do not rule out co-infections with other pathogens, and should not be used as the sole basis for treatment or other patient management decisions. Negative results must be combined with clinical observations, patient history, and epidemiological information. The expected result is Negative.  Fact  Sheet for Patients: SugarRoll.be  Fact Sheet for Healthcare Providers: https://www.woods-mathews.com/  This test is not yet approved or cleared by the [place] FDA and  has been authorized for detection and/or diagnosis of SARS-CoV-2 by FDA under an Emergency Use Authorization (EUA). This EUA will remain  in effect (meaning this test can be used) for the duration of the COVID-19 declaration under Se ction 564(b)(1) of the Act, 21 U.S.C. section 360bbb-3(b)(1), unless the authorization is terminated or revoked sooner.  Performed at [facility], [place] [address], [place], [place] [address]      Time coordinating discharge: Over 30 minutes  SIGNED:   Nicolette Bang, MD  [facility] 03/04/2021, 4:53 PM Pager   If 7PM-7AM, please contact night-coverage [email or web address] Password [email or web address]

## 2021-03-04 NOTE — Progress Notes (Signed)
Spoke with patient concerning nocturnal CPAP. She admits to sometimes sleeping on the sofa at home and not using her machine. However she states she is mostly compliant. She expresses concerns that she does not tolerate any apparatus other than nasal pillows, which is not offered as an option through the hospital. She is aware that she may have her pillows brought in from home. For now, she has declined the use of nocturnal CPAP but agrees to have her nasal pillows brought in from home should she require several nights stay. RT will continue to follow and encourage use.

## 2021-03-21 DIAGNOSIS — G4733 Obstructive sleep apnea (adult) (pediatric): Secondary | ICD-10-CM | POA: Diagnosis not present

## 2021-04-21 DIAGNOSIS — G4733 Obstructive sleep apnea (adult) (pediatric): Secondary | ICD-10-CM | POA: Diagnosis not present

## 2021-04-29 DIAGNOSIS — E559 Vitamin D deficiency, unspecified: Secondary | ICD-10-CM | POA: Diagnosis not present

## 2021-04-29 DIAGNOSIS — E039 Hypothyroidism, unspecified: Secondary | ICD-10-CM | POA: Diagnosis not present

## 2021-04-29 DIAGNOSIS — E1165 Type 2 diabetes mellitus with hyperglycemia: Secondary | ICD-10-CM | POA: Diagnosis not present

## 2021-04-29 DIAGNOSIS — E7800 Pure hypercholesterolemia, unspecified: Secondary | ICD-10-CM | POA: Diagnosis not present

## 2021-05-03 DIAGNOSIS — R42 Dizziness and giddiness: Secondary | ICD-10-CM | POA: Diagnosis not present

## 2021-05-03 DIAGNOSIS — E1165 Type 2 diabetes mellitus with hyperglycemia: Secondary | ICD-10-CM | POA: Diagnosis not present

## 2021-05-03 DIAGNOSIS — R55 Syncope and collapse: Secondary | ICD-10-CM | POA: Diagnosis not present

## 2021-05-06 DIAGNOSIS — E559 Vitamin D deficiency, unspecified: Secondary | ICD-10-CM | POA: Diagnosis not present

## 2021-05-06 DIAGNOSIS — I10 Essential (primary) hypertension: Secondary | ICD-10-CM | POA: Diagnosis not present

## 2021-05-06 DIAGNOSIS — E039 Hypothyroidism, unspecified: Secondary | ICD-10-CM | POA: Diagnosis not present

## 2021-05-06 DIAGNOSIS — E1165 Type 2 diabetes mellitus with hyperglycemia: Secondary | ICD-10-CM | POA: Diagnosis not present

## 2021-05-06 DIAGNOSIS — E7800 Pure hypercholesterolemia, unspecified: Secondary | ICD-10-CM | POA: Diagnosis not present

## 2021-05-06 DIAGNOSIS — E1121 Type 2 diabetes mellitus with diabetic nephropathy: Secondary | ICD-10-CM | POA: Diagnosis not present

## 2021-05-21 DIAGNOSIS — G4733 Obstructive sleep apnea (adult) (pediatric): Secondary | ICD-10-CM | POA: Diagnosis not present

## 2021-07-26 ENCOUNTER — Other Ambulatory Visit: Payer: Self-pay

## 2021-07-26 ENCOUNTER — Ambulatory Visit: Payer: Medicare PPO | Admitting: Internal Medicine

## 2021-07-26 ENCOUNTER — Encounter: Payer: Self-pay | Admitting: *Deleted

## 2021-07-26 ENCOUNTER — Encounter: Payer: Self-pay | Admitting: Internal Medicine

## 2021-07-26 VITALS — BP 110/68 | HR 75 | Ht 63.0 in | Wt 140.2 lb

## 2021-07-26 DIAGNOSIS — Z79899 Other long term (current) drug therapy: Secondary | ICD-10-CM | POA: Diagnosis not present

## 2021-07-26 DIAGNOSIS — N289 Disorder of kidney and ureter, unspecified: Secondary | ICD-10-CM | POA: Diagnosis not present

## 2021-07-26 DIAGNOSIS — I10 Essential (primary) hypertension: Secondary | ICD-10-CM

## 2021-07-26 DIAGNOSIS — R55 Syncope and collapse: Secondary | ICD-10-CM

## 2021-07-26 DIAGNOSIS — I951 Orthostatic hypotension: Secondary | ICD-10-CM

## 2021-07-26 DIAGNOSIS — E876 Hypokalemia: Secondary | ICD-10-CM | POA: Diagnosis not present

## 2021-07-26 NOTE — Patient Instructions (Addendum)
Medication Instructions:  STOP: LOSARTAN  *If you need a refill on your cardiac medications before your next appointment, please call your pharmacy*  Testing/Procedures: Your physician has requested that you have an echocardiogram. Echocardiography is a painless test that uses sound waves to create images of your heart. It provides your doctor with information about the size and shape of your heart and how well your heart's chambers and valves are working. You may receive an ultrasound enhancing agent through an IV if needed to better visualize your heart during the echo.This procedure takes approximately one hour. There are no restrictions for this procedure. This will take place at the [address].   Your physician has requested that you have a carotid duplex. This test is an ultrasound of the carotid arteries in your neck. It looks at blood flow through these arteries that supply the brain with blood. Allow one hour for this exam. There are no restrictions or special instructions.  [employer] Cardiac Event Monitor Instructions Your physician has requested you wear your cardiac event monitor for 30 days, (1-30). [employer] may call or text to confirm a shipping address. The monitor will be sent to a land address via UPS. [employer] will not ship a monitor to a PO BOX. It typically takes 3-5 days to receive your monitor after it has been enrolled. [employer] will assist with USPS tracking if your package is delayed. The telephone number for [employer] is [phone]. Once you have received your monitor, please review the enclosed instructions. Instruction tutorials can also be viewed under help and settings on the enclosed cell phone. Your monitor has already been registered assigning a specific monitor serial # to you.  Applying the monitor Remove cell phone from case and turn it on. The cell phone works as Dealer and needs to be within [employer] of you at all  times. The cell phone will need to be charged on a daily basis. We recommend you plug the cell phone into the enclosed charger at your bedside table every night.  Monitor batteries: You will receive two monitor batteries labelled #1 and #2. These are your recorders. Plug battery #2 onto the second connection on the enclosed charger. Keep one battery on the charger at all times. This will keep the monitor battery deactivated. It will also keep it fully charged for when you need to switch your monitor batteries. A small light will be blinking on the battery emblem when it is charging. The light on the battery emblem will remain on when the battery is fully charged.  Open package of a Monitor strip. Insert battery #1 into black hood on strip and gently squeeze monitor battery onto connection as indicated in instruction booklet. Set aside while preparing skin.  Choose location for your strip, vertical or horizontal, as indicated in the instruction booklet. Shave to remove all hair from location. There cannot be any lotions, oils, powders, or colognes on skin where monitor is to be applied. Wipe skin clean with enclosed Saline wipe. Dry skin completely.  Peel paper labeled #1 off the back of the Monitor strip exposing the adhesive. Place the monitor on the chest in the vertical or horizontal position shown in the instruction booklet. One arrow on the monitor strip must be pointing upward. Carefully remove paper labeled #2, attaching remainder of strip to your skin. Try not to create any folds or wrinkles in the strip as you apply it.  Firmly press and release the circle in  the center of the monitor battery. You will hear a small beep. This is turning the monitor battery on. The heart emblem on the monitor battery will light up every 5 seconds if the monitor battery in turned on and connected to the patient securely. Do not push and hold the circle down as this turns the monitor battery off. The  cell phone will locate the monitor battery. A screen will appear on the cell phone checking the connection of your monitor strip. This may read poor connection initially but change to good connection within the next minute. Once your monitor accepts the connection you will hear a series of 3 beeps followed by a climbing crescendo of beeps. A screen will appear on the cell phone showing the two monitor strip placement options. Touch the picture that demonstrates where you applied the monitor strip.  Your monitor strip and battery are waterproof. You are able to shower, bathe, or swim with the monitor on. They just ask you do not submerge deeper than 3 feet underwater. We recommend removing the monitor if you are swimming in a lake, river, or ocean.  Your monitor battery will need to be switched to a fully charged monitor battery approximately once a week. The cell phone will alert you of an action which needs to be made.  On the cell phone, tap for details to reveal connection status, monitor battery status, and cell phone battery status. The green dots indicates your monitor is in good status. A red dot indicates there is something that needs your attention.  To record a symptom, click the circle on the monitor battery. In 30-60 seconds a list of symptoms will appear on the cell phone. Select your symptom and tap save. Your monitor will record a sustained or significant arrhythmia regardless of you clicking the button. Some patients do not feel the heart rhythm irregularities. [employer] will notify us of any serious or critical events.  Refer to instruction booklet for instructions on switching batteries, changing strips, the Do not disturb or Pause features, or any additional questions.  Call [employer] at [phone], to confirm your monitor is transmitting and record your baseline. They will answer any questions you may have regarding the monitor instructions at that  time.  Returning the monitor to [place] all equipment back into blue box. Peel off strip of paper to expose adhesive and close box securely. There is a prepaid UPS shipping label on this box. Drop in a UPS drop box, or at a UPS facility like [employer]. You may also contact [employer] to arrange UPS to pick up monitor package at your home.  Follow-Up: At [facility], you and your health needs are our priority.  As part of our continuing mission to provide you with exceptional heart care, we have created designated Provider Care Teams.  These Care Teams include your primary Cardiologist (physician) and Advanced Practice Providers (APPs -  Physician Assistants and Nurse Practitioners) who all work together to provide you with the care you need, when you need it.   Your next appointment:   RIGHT AFTER ECHO  The format for your next appointment:   In Person  Provider:   Cherlynn Kaiser, MD  Other Instructions [place]

## 2021-07-26 NOTE — Progress Notes (Unsigned)
30 Day Event Monitor mailed to pt.
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
## 2021-07-31 ENCOUNTER — Ambulatory Visit (INDEPENDENT_AMBULATORY_CARE_PROVIDER_SITE_OTHER): Payer: Medicare PPO

## 2021-07-31 DIAGNOSIS — R55 Syncope and collapse: Secondary | ICD-10-CM

## 2021-08-01 ENCOUNTER — Other Ambulatory Visit: Payer: Self-pay

## 2021-08-01 ENCOUNTER — Ambulatory Visit (HOSPITAL_COMMUNITY)
Admission: RE | Admit: 2021-08-01 | Discharge: 2021-08-01 | Disposition: A | Discharge status: Home / Self Care | Payer: Medicare PPO | Source: Ambulatory Visit | Attending: Cardiology | Admitting: Cardiology

## 2021-08-01 DIAGNOSIS — R55 Syncope and collapse: Secondary | ICD-10-CM | POA: Diagnosis not present

## 2021-08-18 ENCOUNTER — Other Ambulatory Visit: Payer: Self-pay

## 2021-08-18 ENCOUNTER — Ambulatory Visit (HOSPITAL_COMMUNITY): Payer: Medicare PPO | Attending: Cardiology

## 2021-08-18 DIAGNOSIS — R55 Syncope and collapse: Secondary | ICD-10-CM | POA: Diagnosis not present

## 2021-08-18 LAB — ECHOCARDIOGRAM COMPLETE
Area-P 1/2: 2.87 cm2
S' Lateral: 2.6 cm

## 2021-08-22 ENCOUNTER — Telehealth: Payer: Self-pay

## 2021-08-22 NOTE — Telephone Encounter (Signed)
Called patient due to receiving notice from [employer] heart monitors into triage regarding no data transmission for 2 days. Per patient she is still wearing her heart monitor. Patient states that her phone with her event monitor was completely off and she was unsure why. Patient turned phone back on and got notification that she is being monitored now. Advised patient to call back with any issues. Patient verbalized understanding.
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
## 2021-09-02 ENCOUNTER — Encounter: Payer: Self-pay | Admitting: Internal Medicine

## 2021-09-02 ENCOUNTER — Ambulatory Visit: Payer: Medicare PPO | Admitting: Internal Medicine

## 2021-09-02 ENCOUNTER — Other Ambulatory Visit: Payer: Self-pay

## 2021-09-02 VITALS — BP 122/70 | HR 114 | Ht 63.0 in | Wt 142.8 lb

## 2021-09-02 DIAGNOSIS — R55 Syncope and collapse: Secondary | ICD-10-CM

## 2021-09-02 DIAGNOSIS — Z79899 Other long term (current) drug therapy: Secondary | ICD-10-CM

## 2021-09-02 DIAGNOSIS — I951 Orthostatic hypotension: Secondary | ICD-10-CM | POA: Diagnosis not present

## 2021-09-02 DIAGNOSIS — I10 Essential (primary) hypertension: Secondary | ICD-10-CM | POA: Diagnosis not present
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
## 2021-09-02 NOTE — Patient Instructions (Signed)
Medication Instructions:  No Changes In Medications at this time.  *If you need a refill on your cardiac medications before your next appointment, please call your pharmacy*  Follow-Up: At [facility], you and your health needs are our priority.  As part of our continuing mission to provide you with exceptional heart care, we have created designated Provider Care Teams.  These Care Teams include your primary Cardiologist (physician) and Advanced Practice Providers (APPs -  Physician Assistants and Nurse Practitioners) who all work together to provide you with the care you need, when you need it.  Your next appointment:   3 -4 month(s)  The format for your next appointment:   In Person  Provider:   CALLIE GOODRICH PA-C [place] PA-C  Other Instructions REFERRAL TO [place]

## 2021-09-06 DIAGNOSIS — E559 Vitamin D deficiency, unspecified: Secondary | ICD-10-CM | POA: Diagnosis not present

## 2021-09-06 DIAGNOSIS — I10 Essential (primary) hypertension: Secondary | ICD-10-CM | POA: Diagnosis not present

## 2021-09-13 DIAGNOSIS — F3342 Major depressive disorder, recurrent, in full remission: Secondary | ICD-10-CM | POA: Diagnosis not present

## 2021-09-13 DIAGNOSIS — Z0000 Encounter for general adult medical examination without abnormal findings: Secondary | ICD-10-CM | POA: Diagnosis not present

## 2021-09-13 DIAGNOSIS — R251 Tremor, unspecified: Secondary | ICD-10-CM | POA: Diagnosis not present

## 2021-09-13 DIAGNOSIS — R7401 Elevation of levels of liver transaminase levels: Secondary | ICD-10-CM | POA: Diagnosis not present

## 2021-09-13 DIAGNOSIS — E039 Hypothyroidism, unspecified: Secondary | ICD-10-CM | POA: Diagnosis not present

## 2021-09-13 DIAGNOSIS — N1831 Chronic kidney disease, stage 3a: Secondary | ICD-10-CM | POA: Diagnosis not present

## 2021-09-13 DIAGNOSIS — Z23 Encounter for immunization: Secondary | ICD-10-CM | POA: Diagnosis not present

## 2021-09-13 DIAGNOSIS — I951 Orthostatic hypotension: Secondary | ICD-10-CM | POA: Diagnosis not present

## 2021-09-13 DIAGNOSIS — E1121 Type 2 diabetes mellitus with diabetic nephropathy: Secondary | ICD-10-CM | POA: Diagnosis not present

## 2021-09-13 DIAGNOSIS — R748 Abnormal levels of other serum enzymes: Secondary | ICD-10-CM | POA: Diagnosis not present

## 2021-09-15 DIAGNOSIS — R748 Abnormal levels of other serum enzymes: Secondary | ICD-10-CM | POA: Diagnosis not present

## 2021-09-20 DIAGNOSIS — R059 Cough, unspecified: Secondary | ICD-10-CM | POA: Diagnosis not present

## 2021-09-20 DIAGNOSIS — R748 Abnormal levels of other serum enzymes: Secondary | ICD-10-CM | POA: Diagnosis not present

## 2021-09-20 DIAGNOSIS — I10 Essential (primary) hypertension: Secondary | ICD-10-CM | POA: Diagnosis not present

## 2021-09-21 DIAGNOSIS — R059 Cough, unspecified: Secondary | ICD-10-CM | POA: Diagnosis not present

## 2021-10-17 ENCOUNTER — Encounter: Payer: Self-pay | Admitting: Neurology

## 2021-10-17 ENCOUNTER — Other Ambulatory Visit: Payer: Self-pay

## 2021-10-17 ENCOUNTER — Ambulatory Visit: Payer: Medicare PPO | Admitting: Neurology

## 2021-10-17 VITALS — BP 155/91 | HR 72 | Ht 63.0 in | Wt 136.5 lb

## 2021-10-17 DIAGNOSIS — I951 Orthostatic hypotension: Secondary | ICD-10-CM | POA: Diagnosis not present

## 2021-10-17 DIAGNOSIS — G250 Essential tremor: Secondary | ICD-10-CM | POA: Diagnosis not present

## 2021-10-17 MED ORDER — PRIMIDONE 50 MG PO TABS: 50.0000 mg | ORAL_TABLET | Freq: Two times a day (BID) | ORAL | 6 refills | Status: DC
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
## 2021-10-17 NOTE — Patient Instructions (Addendum)
Continue current medications  Start Primidone for Essential tremor  Take 1/2 tab daily for one week  Then increase to 1/2 twice a day for one week  Then increase to 1/2 tab in the morning and 1 tab in the evening for one week  Then increase to 1 tab twice a day  Recommend compression stocking for the orthostatic hypotension  Return in 6 months

## 2021-10-25 DIAGNOSIS — I10 Essential (primary) hypertension: Secondary | ICD-10-CM | POA: Diagnosis not present

## 2021-10-25 DIAGNOSIS — I951 Orthostatic hypotension: Secondary | ICD-10-CM | POA: Diagnosis not present

## 2021-10-25 DIAGNOSIS — R748 Abnormal levels of other serum enzymes: Secondary | ICD-10-CM | POA: Diagnosis not present

## 2021-10-25 DIAGNOSIS — R55 Syncope and collapse: Secondary | ICD-10-CM | POA: Diagnosis not present

## 2021-10-27 DIAGNOSIS — E559 Vitamin D deficiency, unspecified: Secondary | ICD-10-CM | POA: Diagnosis not present

## 2021-10-27 DIAGNOSIS — E1165 Type 2 diabetes mellitus with hyperglycemia: Secondary | ICD-10-CM | POA: Diagnosis not present

## 2021-10-27 DIAGNOSIS — E039 Hypothyroidism, unspecified: Secondary | ICD-10-CM | POA: Diagnosis not present

## 2021-10-27 DIAGNOSIS — E7800 Pure hypercholesterolemia, unspecified: Secondary | ICD-10-CM | POA: Diagnosis not present

## 2021-11-03 DIAGNOSIS — E559 Vitamin D deficiency, unspecified: Secondary | ICD-10-CM | POA: Diagnosis not present

## 2021-11-03 DIAGNOSIS — E039 Hypothyroidism, unspecified: Secondary | ICD-10-CM | POA: Diagnosis not present

## 2021-11-03 DIAGNOSIS — M810 Age-related osteoporosis without current pathological fracture: Secondary | ICD-10-CM | POA: Diagnosis not present

## 2021-11-03 DIAGNOSIS — E7800 Pure hypercholesterolemia, unspecified: Secondary | ICD-10-CM | POA: Diagnosis not present

## 2021-11-03 DIAGNOSIS — I10 Essential (primary) hypertension: Secondary | ICD-10-CM | POA: Diagnosis not present

## 2021-11-03 DIAGNOSIS — E1165 Type 2 diabetes mellitus with hyperglycemia: Secondary | ICD-10-CM | POA: Diagnosis not present

## 2021-11-07 DIAGNOSIS — R052 Subacute cough: Secondary | ICD-10-CM | POA: Diagnosis not present

## 2021-11-07 DIAGNOSIS — R55 Syncope and collapse: Secondary | ICD-10-CM | POA: Diagnosis not present

## 2021-11-07 DIAGNOSIS — R748 Abnormal levels of other serum enzymes: Secondary | ICD-10-CM | POA: Diagnosis not present

## 2021-11-07 DIAGNOSIS — I10 Essential (primary) hypertension: Secondary | ICD-10-CM | POA: Diagnosis not present

## 2021-11-07 DIAGNOSIS — I951 Orthostatic hypotension: Secondary | ICD-10-CM | POA: Diagnosis not present

## 2021-11-15 DIAGNOSIS — M810 Age-related osteoporosis without current pathological fracture: Secondary | ICD-10-CM | POA: Diagnosis not present
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
## 2021-12-14 ENCOUNTER — Other Ambulatory Visit: Payer: Self-pay

## 2021-12-14 ENCOUNTER — Encounter: Payer: Self-pay | Admitting: Physician Assistant

## 2021-12-14 ENCOUNTER — Ambulatory Visit: Payer: Medicare PPO | Admitting: Physician Assistant

## 2021-12-14 VITALS — BP 122/74 | HR 74 | Ht 63.0 in | Wt 137.0 lb

## 2021-12-14 DIAGNOSIS — R55 Syncope and collapse: Secondary | ICD-10-CM | POA: Diagnosis not present

## 2021-12-14 DIAGNOSIS — I951 Orthostatic hypotension: Secondary | ICD-10-CM

## 2021-12-14 DIAGNOSIS — I10 Essential (primary) hypertension: Secondary | ICD-10-CM | POA: Diagnosis not present

## 2021-12-14 NOTE — Patient Instructions (Signed)
Medication Instructions:  Your physician recommends that you continue on your current medications as directed. Please refer to the Current Medication list given to you today.   *If you need a refill on your cardiac medications before your next appointment, please call your pharmacy*   Lab Work: NONE ordered at this time of appointment   If you have labs (blood work) drawn today and your tests are completely normal, you will receive your results only by: [place] (if you have MyChart) OR A paper copy in the mail If you have any lab test that is abnormal or we need to change your treatment, we will call you to review the results.   Testing/Procedures: NONE ordered at this time of appointment     Follow-Up: At [facility], you and your health needs are our priority.  As part of our continuing mission to provide you with exceptional heart care, we have created designated Provider Care Teams.  These Care Teams include your primary Cardiologist (physician) and Advanced Practice Providers (APPs -  Physician Assistants and Nurse Practitioners) who all work together to provide you with the care you need, when you need it.  We recommend signing up for the patient portal called "MyChart".  Sign up information is provided on this After Visit Summary.  MyChart is used to connect with patients for Virtual Visits (Telemedicine).  Patients are able to view lab/test results, encounter notes, upcoming appointments, etc.  Non-urgent messages can be sent to your provider as well.   To learn more about what you can do with MyChart, go to [email or web address].    Your next appointment:   6 month(s)  The format for your next appointment:   In Person  Provider:   APP or Elouise Munroe, MD  If MD is not listed, click here to update    :1}    Other Instructions Orthostatic Hypotension Blood pressure is a measurement of how strongly, or weakly, your circulating blood is pressing against  the walls of your arteries. Orthostatic hypotension is a drop in blood pressure that can happen when you change positions, such as when you go from lying down to standing. Arteries are blood vessels that carry blood from your heart throughout your body. When blood pressure is too low, you may not get enough blood to your brain or to the rest of your organs. Orthostatic hypotension can cause light-headedness, sweating, rapid heartbeat, blurred vision, and fainting. These symptoms require further investigation into the cause. What are the causes? Orthostatic hypotension can be caused by many things, including: Sudden changes in posture, such as standing up quickly after you have been sitting or lying down. Loss of blood (anemia) or loss of body fluids (dehydration). Heart problems, neurologic problems, or hormone problems. Pregnancy. Aging. The risk for this condition increases as you get older. Severe infection (sepsis). Certain medicines, such as medicines for high blood pressure or medicines that make the body lose excess fluids (diuretics). What are the signs or symptoms? Symptoms of this condition may include: Weakness, light-headedness, or dizziness. Sweating. Blurred vision. Tiredness (fatigue). Rapid heartbeat. Fainting, in severe cases. How is this diagnosed? This condition is diagnosed based on: Your symptoms and medical history. Your blood pressure measurements. Your health care provider will check your blood pressure when you are: Lying down. Sitting. Standing. A blood pressure reading is recorded as two numbers, such as "120 over 80" (or 120/80). The first ("top") number is called the systolic pressure. It is a measure of  the pressure in your arteries as your heart beats. The second ("bottom") number is called the diastolic pressure. It is a measure of the pressure in your arteries when your heart relaxes between beats. Blood pressure is measured in a unit called mmHg. Healthy  blood pressure for most adults is 120/80 mmHg. Orthostatic hypotension is defined as a 20 mmHg drop in systolic pressure or a 10 mmHg drop in diastolic pressure within 3 minutes of standing. Other information or tests that may be used to diagnose orthostatic hypotension include: Your other vital signs, such as your heart rate and temperature. Blood tests. An electrocardiogram (ECG) or echocardiogram. A Holter monitor. This is a device you wear that records your heart rhythm continuously, usually for 24-48 hours. Tilt table test. For this test, you will be safely secured to a table that moves you from a lying position to an upright position. Your heart rhythm and blood pressure will be monitored during the test. How is this treated? This condition may be treated by: Changing your diet. This may involve eating more salt (sodium) or drinking more water. Changing the dosage of certain medicines you are taking that might be lowering your blood pressure. Correcting the underlying reason for the orthostatic hypotension. Wearing compression stockings. Taking medicines to raise your blood pressure. Avoiding actions that trigger symptoms. Follow these instructions at home: Medicines Take over-the-counter and prescription medicines only as told by your health care provider. Follow instructions from your health care provider about changing the dosage of your current medicines, if this applies. Do not stop or adjust any of your medicines on your own. Eating and drinking  Drink enough fluid to keep your urine pale yellow. Eat extra salt only as directed. Do not add extra salt to your diet unless advised by your health care provider. Eat frequent, small meals. Avoid standing up suddenly after eating. General instructions  Get up slowly from lying down or sitting positions. This gives your blood pressure a chance to adjust. Avoid hot showers and excessive heat as directed by your health care  provider. Engage in regular physical activity as directed by your health care provider. If you have compression stockings, wear them as told. Keep all follow-up visits. This is important. Contact a health care provider if: You have a fever for more than 2-3 days. You feel more thirsty than usual. You feel dizzy or weak. Get help right away if: You have chest pain. You have a fast or irregular heartbeat. You become sweaty or feel light-headed. You feel short of breath. You faint. You have any symptoms of a stroke. "BE FAST" is an easy way to remember the main warning signs of a stroke: B - Balance. Signs are dizziness, sudden trouble walking, or loss of balance. E - Eyes. Signs are trouble seeing or a sudden change in vision. F - Face. Signs are sudden weakness or numbness of the face, or the face or eyelid drooping on one side. A - Arms. Signs are weakness or numbness in an arm. This happens suddenly and usually on one side of the body. S - Speech. Signs are sudden trouble speaking, slurred speech, or trouble understanding what people say. T - Time. Time to call emergency services. Write down what time symptoms started. You have other signs of a stroke, such as: A sudden, severe headache with no known cause. Nausea or vomiting. Seizure. These symptoms may represent a serious problem that is an emergency. Do not wait to see if the symptoms  will go away. Get medical help right away. Call your local emergency services (911 in the [place] Do not drive yourself to the hospital. Summary Orthostatic hypotension is a sudden drop in blood pressure. It can cause light-headedness, sweating, rapid heartbeat, blurred vision, and fainting. Orthostatic hypotension can be diagnosed by having your blood pressure taken while lying down, sitting, and then standing. Treatment may involve changing your diet, wearing compression stockings, sitting up slowly, adjusting your medicines, or correcting the  underlying reason for the orthostatic hypotension. Get help right away if you have chest pain, a fast or irregular heartbeat, or symptoms of a stroke. This information is not intended to replace advice given to you by your health care provider. Make sure you discuss any questions you have with your health care provider. Document Revised: 01/20/2021 Document Reviewed: 01/20/2021 Elsevier Patient Education  [place] 64 OZ OF WATER DAILY OR 4 16 OZ OF WATER DAILY

## 2021-12-19 DIAGNOSIS — Z86010 Personal history of colonic polyps: Secondary | ICD-10-CM | POA: Diagnosis not present

## 2021-12-19 DIAGNOSIS — Z9884 Bariatric surgery status: Secondary | ICD-10-CM | POA: Diagnosis not present

## 2021-12-19 DIAGNOSIS — R748 Abnormal levels of other serum enzymes: Secondary | ICD-10-CM | POA: Diagnosis not present

## 2021-12-29 DIAGNOSIS — Z1151 Encounter for screening for human papillomavirus (HPV): Secondary | ICD-10-CM | POA: Diagnosis not present

## 2021-12-29 DIAGNOSIS — Z124 Encounter for screening for malignant neoplasm of cervix: Secondary | ICD-10-CM | POA: Diagnosis not present

## 2021-12-29 DIAGNOSIS — Z1231 Encounter for screening mammogram for malignant neoplasm of breast: Secondary | ICD-10-CM | POA: Diagnosis not present

## 2021-12-29 DIAGNOSIS — Z6823 Body mass index (BMI) 23.0-23.9, adult: Secondary | ICD-10-CM | POA: Diagnosis not present

## 2022-04-15 ENCOUNTER — Other Ambulatory Visit: Payer: Self-pay | Admitting: Neurology

## 2022-04-18 ENCOUNTER — Ambulatory Visit: Payer: Medicare PPO | Admitting: Neurology

## 2022-04-18 NOTE — Telephone Encounter (Signed)
Rx refilled.

## 2022-04-24 ENCOUNTER — Encounter: Payer: Self-pay | Admitting: Neurology

## 2022-04-24 ENCOUNTER — Ambulatory Visit: Payer: Medicare PPO | Admitting: Neurology

## 2022-05-01 DIAGNOSIS — E7800 Pure hypercholesterolemia, unspecified: Secondary | ICD-10-CM | POA: Diagnosis not present

## 2022-05-01 DIAGNOSIS — E1165 Type 2 diabetes mellitus with hyperglycemia: Secondary | ICD-10-CM | POA: Diagnosis not present

## 2022-05-01 DIAGNOSIS — E559 Vitamin D deficiency, unspecified: Secondary | ICD-10-CM | POA: Diagnosis not present

## 2022-05-01 DIAGNOSIS — E039 Hypothyroidism, unspecified: Secondary | ICD-10-CM | POA: Diagnosis not present

## 2022-05-01 DIAGNOSIS — I10 Essential (primary) hypertension: Secondary | ICD-10-CM | POA: Diagnosis not present

## 2022-05-08 DIAGNOSIS — E039 Hypothyroidism, unspecified: Secondary | ICD-10-CM | POA: Diagnosis not present

## 2022-05-08 DIAGNOSIS — E1121 Type 2 diabetes mellitus with diabetic nephropathy: Secondary | ICD-10-CM | POA: Diagnosis not present

## 2022-05-08 DIAGNOSIS — I10 Essential (primary) hypertension: Secondary | ICD-10-CM | POA: Diagnosis not present

## 2022-05-08 DIAGNOSIS — E7800 Pure hypercholesterolemia, unspecified: Secondary | ICD-10-CM | POA: Diagnosis not present

## 2022-05-08 DIAGNOSIS — E559 Vitamin D deficiency, unspecified: Secondary | ICD-10-CM | POA: Diagnosis not present

## 2022-05-08 DIAGNOSIS — Z9889 Other specified postprocedural states: Secondary | ICD-10-CM | POA: Diagnosis not present

## 2022-05-08 DIAGNOSIS — M8580 Other specified disorders of bone density and structure, unspecified site: Secondary | ICD-10-CM | POA: Diagnosis not present

## 2022-06-08 DIAGNOSIS — M810 Age-related osteoporosis without current pathological fracture: Secondary | ICD-10-CM | POA: Diagnosis not present

## 2022-07-06 DIAGNOSIS — E559 Vitamin D deficiency, unspecified: Secondary | ICD-10-CM | POA: Diagnosis not present

## 2022-07-23 ENCOUNTER — Other Ambulatory Visit: Payer: Self-pay | Admitting: Neurology

## 2022-08-12 IMAGING — CT CT HEAD W/O CM
3 series · 15 of 47 positions shown, 18 images · non-contrast
Comparison: None.

CLINICAL DATA: Dizziness for 2 days, syncope, fell

EXAM:
CT HEAD WITHOUT CONTRAST
TECHNIQUE: Contiguous axial images were obtained from the base of the skull
through the vertex without intravenous contrast.

[Series 3: head wo · axial · 0.42mm/px · z∈[+1432,+1567]mm · 9 of 33 slices shown, 12 images]
[im 3/33  brain]
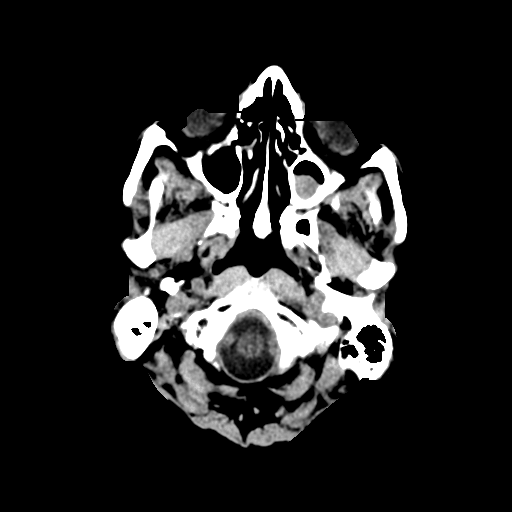
[im 3/33  bone]
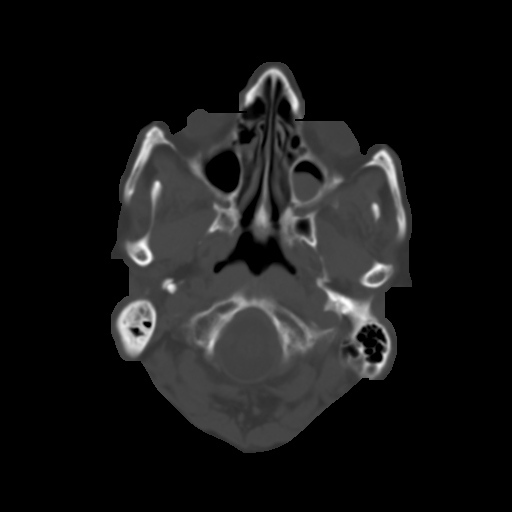
[im 6/33  brain]
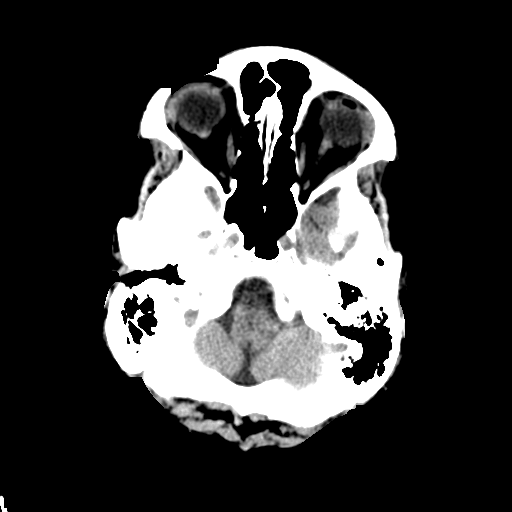
[im 9/33  brain]
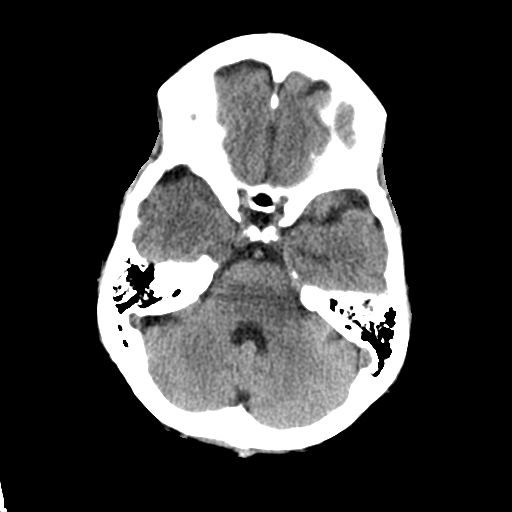
[im 13/33  brain]
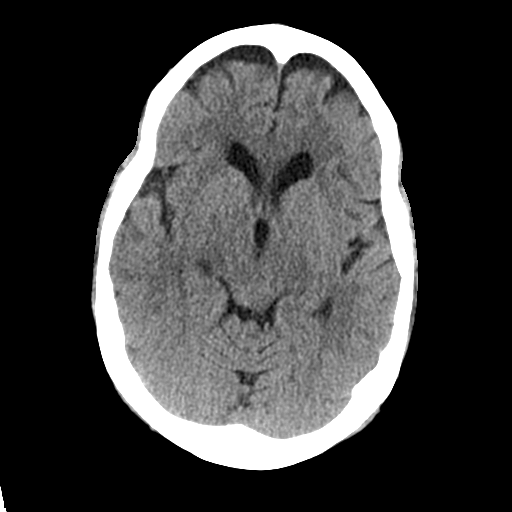
[im 17/33  brain]
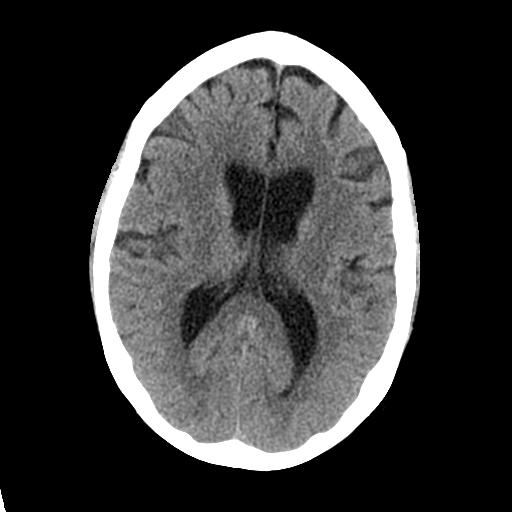
[im 17/33  bone]
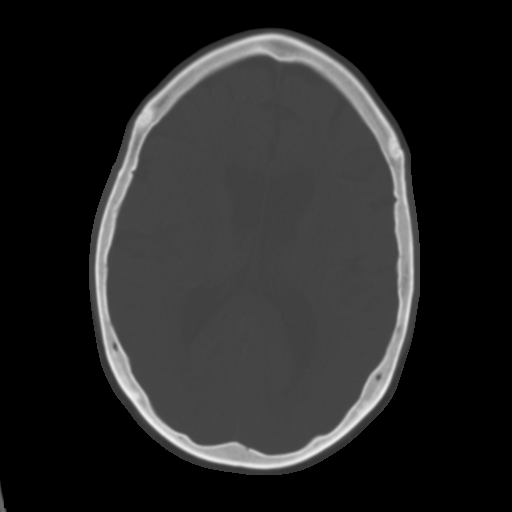
[im 20/33  brain]
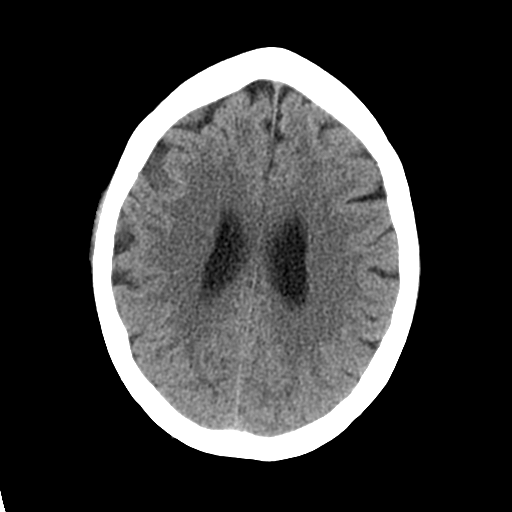
[im 24/33  brain]
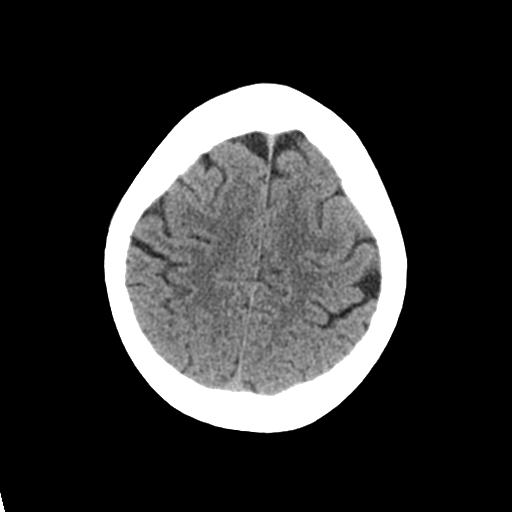
[im 27/33  brain]
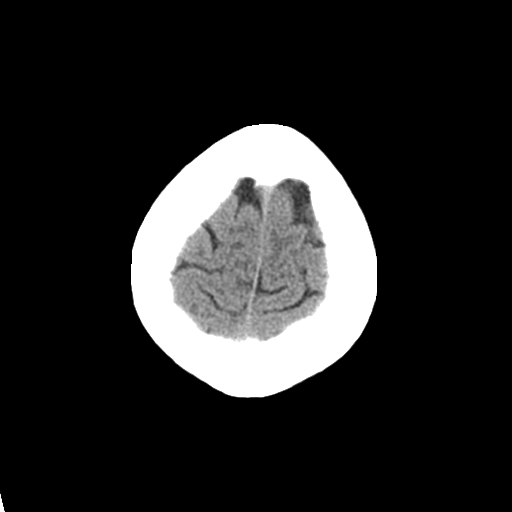
[im 30/33  brain]
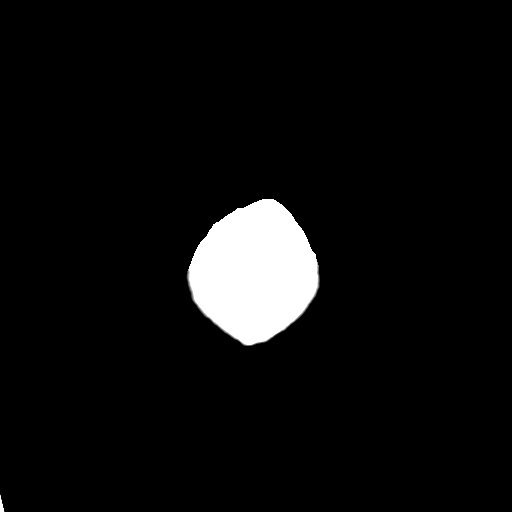
[im 30/33  bone]
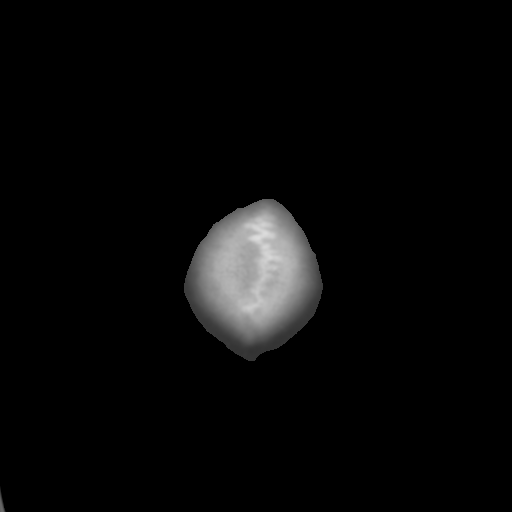

[Series 5: coronal soft tissue · coronal · 0.33mm/px · 3 of 70 slices shown]
[im 24/70  brain]
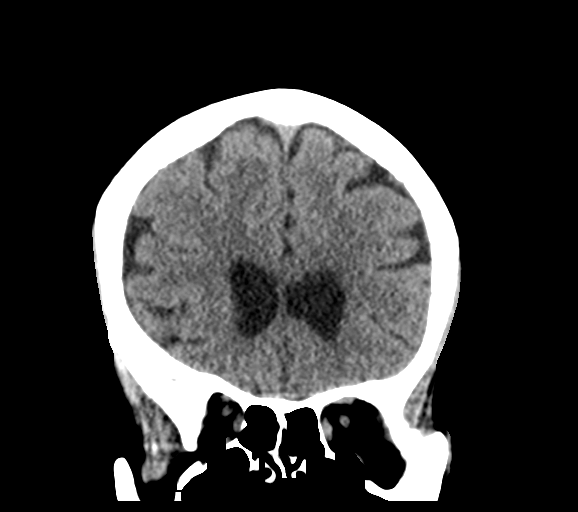
[im 31/70  brain]
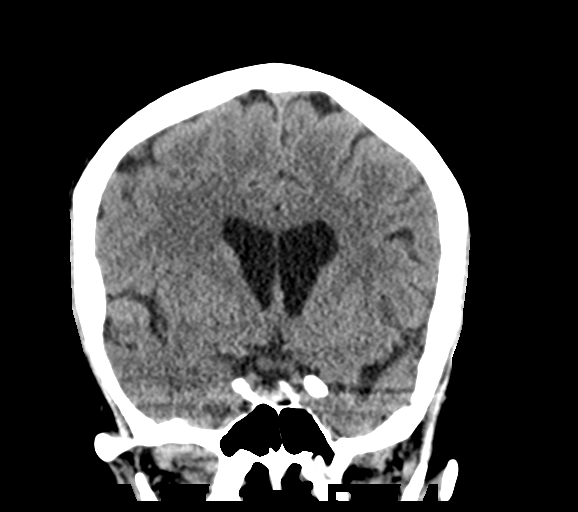
[im 39/70  brain]
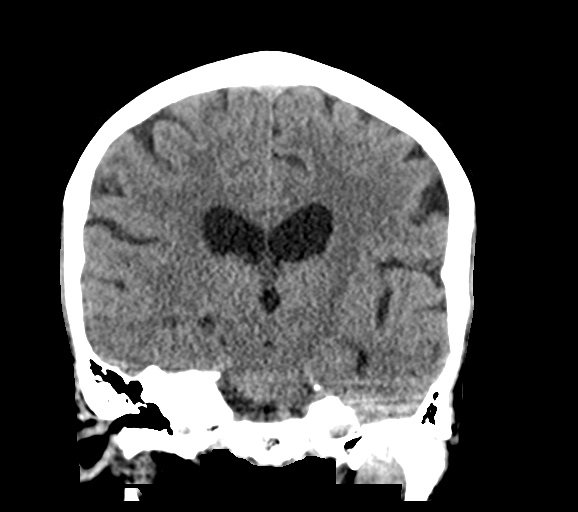

[Series 6: sagittal soft tissue · sagittal · 0.30mm/px · 3 of 55 slices shown]
[im 19/55  brain]
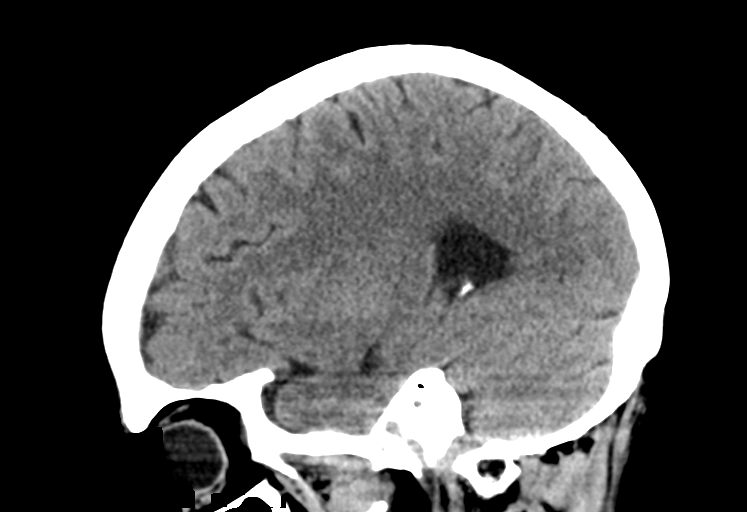
[im 28/55  brain]
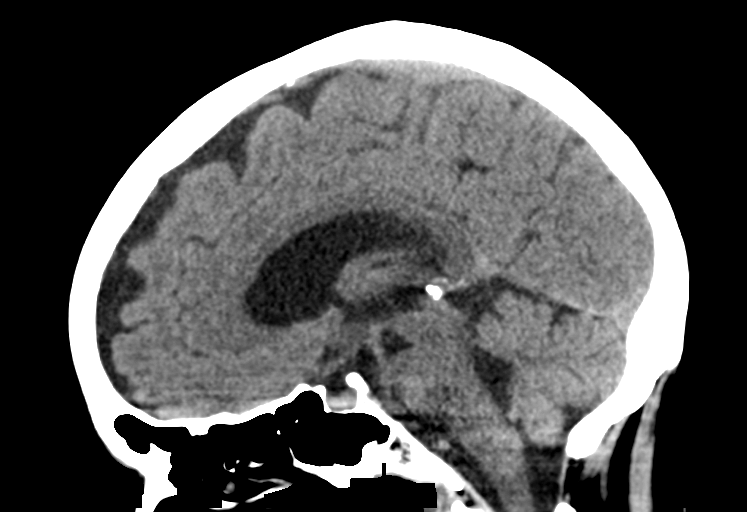
[im 37/55  brain]
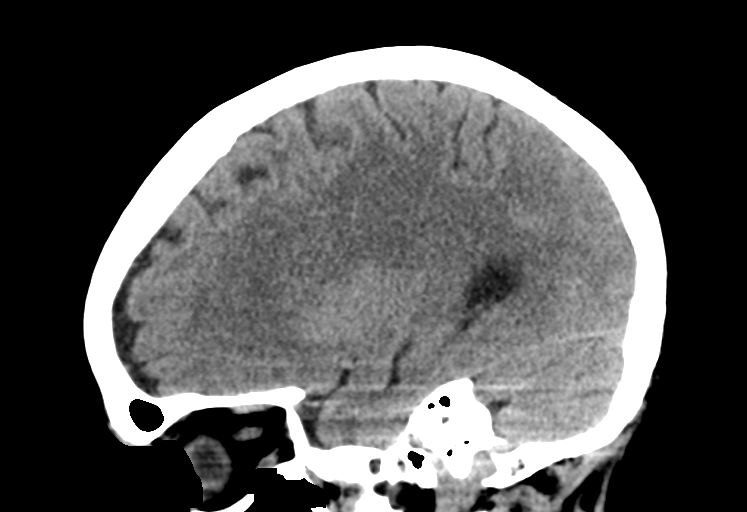

[15 of 47 positions shown; findings below may reference images not displayed]

FINDINGS: Brain: No acute infarct or hemorrhage. Lateral ventricles and
midline structures are unremarkable. No acute extra-axial fluid
collections. No mass effect.

Vascular: No hyperdense vessel or unexpected calcification.

Skull: Normal. Negative for fracture or focal lesion.

Sinuses/Orbits: Polypoid mucosal thickening left maxillary sinus.
Remaining sinuses are clear.

Other: None.
IMPRESSION: 1. No acute intracranial process.

## 2022-09-12 DIAGNOSIS — E1121 Type 2 diabetes mellitus with diabetic nephropathy: Secondary | ICD-10-CM | POA: Diagnosis not present

## 2022-09-12 DIAGNOSIS — E559 Vitamin D deficiency, unspecified: Secondary | ICD-10-CM | POA: Diagnosis not present

## 2022-09-12 DIAGNOSIS — E7800 Pure hypercholesterolemia, unspecified: Secondary | ICD-10-CM | POA: Diagnosis not present

## 2022-09-12 DIAGNOSIS — I10 Essential (primary) hypertension: Secondary | ICD-10-CM | POA: Diagnosis not present

## 2022-09-12 DIAGNOSIS — M810 Age-related osteoporosis without current pathological fracture: Secondary | ICD-10-CM | POA: Diagnosis not present

## 2022-09-15 DIAGNOSIS — F3342 Major depressive disorder, recurrent, in full remission: Secondary | ICD-10-CM | POA: Diagnosis not present

## 2022-09-15 DIAGNOSIS — I951 Orthostatic hypotension: Secondary | ICD-10-CM | POA: Diagnosis not present

## 2022-09-15 DIAGNOSIS — Z23 Encounter for immunization: Secondary | ICD-10-CM | POA: Diagnosis not present

## 2022-09-15 DIAGNOSIS — Z0000 Encounter for general adult medical examination without abnormal findings: Secondary | ICD-10-CM | POA: Diagnosis not present

## 2022-09-15 DIAGNOSIS — E1121 Type 2 diabetes mellitus with diabetic nephropathy: Secondary | ICD-10-CM | POA: Diagnosis not present

## 2022-09-15 DIAGNOSIS — M8580 Other specified disorders of bone density and structure, unspecified site: Secondary | ICD-10-CM | POA: Diagnosis not present

## 2022-09-15 DIAGNOSIS — Z9889 Other specified postprocedural states: Secondary | ICD-10-CM | POA: Diagnosis not present

## 2022-09-15 DIAGNOSIS — N1831 Chronic kidney disease, stage 3a: Secondary | ICD-10-CM | POA: Diagnosis not present

## 2022-09-15 DIAGNOSIS — I10 Essential (primary) hypertension: Secondary | ICD-10-CM | POA: Diagnosis not present

## 2022-09-15 DIAGNOSIS — E039 Hypothyroidism, unspecified: Secondary | ICD-10-CM | POA: Diagnosis not present

## 2022-09-21 DIAGNOSIS — I10 Essential (primary) hypertension: Secondary | ICD-10-CM | POA: Diagnosis not present

## 2022-09-29 DIAGNOSIS — E1122 Type 2 diabetes mellitus with diabetic chronic kidney disease: Secondary | ICD-10-CM | POA: Diagnosis not present

## 2022-09-29 DIAGNOSIS — N1831 Chronic kidney disease, stage 3a: Secondary | ICD-10-CM | POA: Diagnosis not present

## 2022-09-29 DIAGNOSIS — I10 Essential (primary) hypertension: Secondary | ICD-10-CM | POA: Diagnosis not present

## 2022-10-09 DIAGNOSIS — I10 Essential (primary) hypertension: Secondary | ICD-10-CM | POA: Diagnosis not present

## 2022-10-19 DIAGNOSIS — I10 Essential (primary) hypertension: Secondary | ICD-10-CM | POA: Diagnosis not present

## 2022-10-30 DIAGNOSIS — R197 Diarrhea, unspecified: Secondary | ICD-10-CM | POA: Diagnosis not present

## 2022-10-30 DIAGNOSIS — R1111 Vomiting without nausea: Secondary | ICD-10-CM | POA: Diagnosis not present

## 2022-10-30 DIAGNOSIS — I10 Essential (primary) hypertension: Secondary | ICD-10-CM | POA: Diagnosis not present

## 2022-10-30 DIAGNOSIS — H9203 Otalgia, bilateral: Secondary | ICD-10-CM | POA: Diagnosis not present

## 2022-10-30 DIAGNOSIS — K3184 Gastroparesis: Secondary | ICD-10-CM | POA: Diagnosis not present

## 2022-10-31 DIAGNOSIS — R1111 Vomiting without nausea: Secondary | ICD-10-CM | POA: Diagnosis not present

## 2022-10-31 DIAGNOSIS — R197 Diarrhea, unspecified: Secondary | ICD-10-CM | POA: Diagnosis not present

## 2022-12-14 DIAGNOSIS — E559 Vitamin D deficiency, unspecified: Secondary | ICD-10-CM | POA: Diagnosis not present

## 2023-05-31 DIAGNOSIS — E1122 Type 2 diabetes mellitus with diabetic chronic kidney disease: Secondary | ICD-10-CM | POA: Diagnosis not present

## 2023-05-31 DIAGNOSIS — Z9889 Other specified postprocedural states: Secondary | ICD-10-CM | POA: Diagnosis not present

## 2023-05-31 DIAGNOSIS — E039 Hypothyroidism, unspecified: Secondary | ICD-10-CM | POA: Diagnosis not present

## 2023-05-31 DIAGNOSIS — M810 Age-related osteoporosis without current pathological fracture: Secondary | ICD-10-CM | POA: Diagnosis not present

## 2023-05-31 DIAGNOSIS — E559 Vitamin D deficiency, unspecified: Secondary | ICD-10-CM | POA: Diagnosis not present

## 2023-06-21 DIAGNOSIS — E1121 Type 2 diabetes mellitus with diabetic nephropathy: Secondary | ICD-10-CM | POA: Diagnosis not present

## 2023-06-21 DIAGNOSIS — I10 Essential (primary) hypertension: Secondary | ICD-10-CM | POA: Diagnosis not present

## 2023-06-21 DIAGNOSIS — E7800 Pure hypercholesterolemia, unspecified: Secondary | ICD-10-CM | POA: Diagnosis not present

## 2023-06-21 DIAGNOSIS — M810 Age-related osteoporosis without current pathological fracture: Secondary | ICD-10-CM | POA: Diagnosis not present

## 2023-06-21 DIAGNOSIS — E039 Hypothyroidism, unspecified: Secondary | ICD-10-CM | POA: Diagnosis not present

## 2023-06-21 DIAGNOSIS — E1122 Type 2 diabetes mellitus with diabetic chronic kidney disease: Secondary | ICD-10-CM | POA: Diagnosis not present

## 2023-06-21 DIAGNOSIS — E559 Vitamin D deficiency, unspecified: Secondary | ICD-10-CM | POA: Diagnosis not present

## 2023-08-01 DIAGNOSIS — E559 Vitamin D deficiency, unspecified: Secondary | ICD-10-CM | POA: Diagnosis not present

## 2023-08-01 DIAGNOSIS — E1122 Type 2 diabetes mellitus with diabetic chronic kidney disease: Secondary | ICD-10-CM | POA: Diagnosis not present

## 2023-08-01 DIAGNOSIS — E039 Hypothyroidism, unspecified: Secondary | ICD-10-CM | POA: Diagnosis not present

## 2023-08-01 DIAGNOSIS — M810 Age-related osteoporosis without current pathological fracture: Secondary | ICD-10-CM | POA: Diagnosis not present

## 2023-09-28 DIAGNOSIS — I10 Essential (primary) hypertension: Secondary | ICD-10-CM | POA: Diagnosis not present

## 2023-10-01 DIAGNOSIS — E1121 Type 2 diabetes mellitus with diabetic nephropathy: Secondary | ICD-10-CM | POA: Diagnosis not present

## 2023-10-04 ENCOUNTER — Other Ambulatory Visit (HOSPITAL_COMMUNITY): Payer: Self-pay | Admitting: Internal Medicine

## 2023-10-04 DIAGNOSIS — E538 Deficiency of other specified B group vitamins: Secondary | ICD-10-CM | POA: Diagnosis not present

## 2023-10-04 DIAGNOSIS — K3184 Gastroparesis: Secondary | ICD-10-CM | POA: Diagnosis not present

## 2023-10-04 DIAGNOSIS — Z794 Long term (current) use of insulin: Secondary | ICD-10-CM

## 2023-10-04 DIAGNOSIS — N1831 Chronic kidney disease, stage 3a: Secondary | ICD-10-CM | POA: Diagnosis not present

## 2023-10-04 DIAGNOSIS — I10 Essential (primary) hypertension: Secondary | ICD-10-CM

## 2023-10-04 DIAGNOSIS — Z0000 Encounter for general adult medical examination without abnormal findings: Secondary | ICD-10-CM | POA: Diagnosis not present

## 2023-10-04 DIAGNOSIS — D75839 Thrombocytosis, unspecified: Secondary | ICD-10-CM | POA: Diagnosis not present

## 2023-10-04 DIAGNOSIS — E1121 Type 2 diabetes mellitus with diabetic nephropathy: Secondary | ICD-10-CM | POA: Diagnosis not present

## 2023-10-04 DIAGNOSIS — M810 Age-related osteoporosis without current pathological fracture: Secondary | ICD-10-CM | POA: Diagnosis not present

## 2023-10-04 DIAGNOSIS — F3342 Major depressive disorder, recurrent, in full remission: Secondary | ICD-10-CM | POA: Diagnosis not present

## 2023-10-04 DIAGNOSIS — E559 Vitamin D deficiency, unspecified: Secondary | ICD-10-CM | POA: Diagnosis not present

## 2023-10-16 DIAGNOSIS — E7800 Pure hypercholesterolemia, unspecified: Secondary | ICD-10-CM | POA: Diagnosis not present

## 2023-10-16 DIAGNOSIS — R600 Localized edema: Secondary | ICD-10-CM | POA: Diagnosis not present

## 2023-10-16 DIAGNOSIS — Z23 Encounter for immunization: Secondary | ICD-10-CM | POA: Diagnosis not present

## 2023-10-16 DIAGNOSIS — E1121 Type 2 diabetes mellitus with diabetic nephropathy: Secondary | ICD-10-CM | POA: Diagnosis not present

## 2023-10-16 DIAGNOSIS — E559 Vitamin D deficiency, unspecified: Secondary | ICD-10-CM | POA: Diagnosis not present

## 2023-10-16 DIAGNOSIS — M810 Age-related osteoporosis without current pathological fracture: Secondary | ICD-10-CM | POA: Diagnosis not present

## 2023-10-16 DIAGNOSIS — E039 Hypothyroidism, unspecified: Secondary | ICD-10-CM | POA: Diagnosis not present

## 2023-10-16 DIAGNOSIS — I10 Essential (primary) hypertension: Secondary | ICD-10-CM | POA: Diagnosis not present

## 2024-10-01 ENCOUNTER — Encounter: Payer: Self-pay | Admitting: *Deleted
